# Patient Record
Sex: Female | Born: 1990 | Race: Black or African American | Hispanic: No | Marital: Married | Smoking: Never smoker
Health system: Southern US, Community
[De-identification: ages and names within clinical notes are randomized; demographics above are authoritative.]

## PROBLEM LIST (undated history)

## (undated) ENCOUNTER — Inpatient Hospital Stay (HOSPITAL_COMMUNITY): Payer: Self-pay

## (undated) DIAGNOSIS — Z789 Other specified health status: Secondary | ICD-10-CM

## (undated) HISTORY — PX: WISDOM TOOTH EXTRACTION: SHX21

---
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
## 2014-02-22 LAB — OB RESULTS CONSOLE HEPATITIS B SURFACE ANTIGEN: HEP B S AG: NEGATIVE

## 2014-02-22 LAB — OB RESULTS CONSOLE RUBELLA ANTIBODY, IGM: RUBELLA: IMMUNE

## 2014-02-22 LAB — OB RESULTS CONSOLE HIV ANTIBODY (ROUTINE TESTING): HIV: NONREACTIVE

## 2014-02-22 LAB — OB RESULTS CONSOLE RPR: RPR: NONREACTIVE

## 2014-09-19 ENCOUNTER — Inpatient Hospital Stay (HOSPITAL_COMMUNITY): Payer: 59 | Admitting: Anesthesiology

## 2014-09-19 ENCOUNTER — Encounter (HOSPITAL_COMMUNITY): Payer: Self-pay | Admitting: *Deleted

## 2014-09-19 ENCOUNTER — Inpatient Hospital Stay (HOSPITAL_COMMUNITY)
Admission: AD | Admit: 2014-09-19 | Discharge: 2014-09-19 | Disposition: A | Discharge status: Home / Self Care | Payer: 59 | Source: Ambulatory Visit | Attending: Obstetrics and Gynecology | Admitting: Obstetrics and Gynecology

## 2014-09-19 ENCOUNTER — Inpatient Hospital Stay (HOSPITAL_COMMUNITY)
Admission: AD | Admit: 2014-09-19 | Discharge: 2014-09-21 | DRG: 775 | Disposition: A | Discharge status: Home / Self Care | Payer: 59 | Source: Ambulatory Visit | Attending: Obstetrics and Gynecology | Admitting: Obstetrics and Gynecology

## 2014-09-19 DIAGNOSIS — O99824 Streptococcus B carrier state complicating childbirth: Principal | ICD-10-CM | POA: Diagnosis present

## 2014-09-19 DIAGNOSIS — Z3A38 38 weeks gestation of pregnancy: Secondary | ICD-10-CM | POA: Diagnosis present

## 2014-09-19 DIAGNOSIS — Z3490 Encounter for supervision of normal pregnancy, unspecified, unspecified trimester: Secondary | ICD-10-CM

## 2014-09-19 DIAGNOSIS — Z3483 Encounter for supervision of other normal pregnancy, third trimester: Secondary | ICD-10-CM | POA: Diagnosis present

## 2014-09-19 HISTORY — DX: Other specified health status: Z78.9

## 2014-09-19 LAB — RPR: RPR: NONREACTIVE

## 2014-09-19 LAB — CBC
HCT: 37.1 % (ref 36.0–46.0)
Hemoglobin: 12.2 g/dL (ref 12.0–15.0)
MCH: 23.6 pg — AB (ref 26.0–34.0)
MCHC: 32.9 g/dL (ref 30.0–36.0)
MCV: 71.8 fL — ABNORMAL LOW (ref 78.0–100.0)
Platelets: 244 10*3/uL (ref 150–400)
RBC: 5.17 MIL/uL — ABNORMAL HIGH (ref 3.87–5.11)
RDW: 15.4 % (ref 11.5–15.5)
WBC: 8.9 10*3/uL (ref 4.0–10.5)

## 2014-09-19 LAB — CBC WITH DIFFERENTIAL/PLATELET
Basophils Absolute: 0 10*3/uL (ref 0.0–0.1)
Basophils Relative: 0 % (ref 0–1)
Eosinophils Absolute: 0.1 10*3/uL (ref 0.0–0.7)
Eosinophils Relative: 1 % (ref 0–5)
HCT: 36.2 % (ref 36.0–46.0)
HEMOGLOBIN: 11.7 g/dL — AB (ref 12.0–15.0)
LYMPHS ABS: 1.7 10*3/uL (ref 0.7–4.0)
LYMPHS PCT: 22 % (ref 12–46)
MCH: 23.2 pg — AB (ref 26.0–34.0)
MCHC: 32.3 g/dL (ref 30.0–36.0)
MCV: 71.8 fL — ABNORMAL LOW (ref 78.0–100.0)
MONO ABS: 0.8 10*3/uL (ref 0.1–1.0)
MONOS PCT: 10 % (ref 3–12)
NEUTROS PCT: 67 % (ref 43–77)
Neutro Abs: 5.3 10*3/uL (ref 1.7–7.7)
Platelets: 215 10*3/uL (ref 150–400)
RBC: 5.04 MIL/uL (ref 3.87–5.11)
RDW: 15.6 % — ABNORMAL HIGH (ref 11.5–15.5)
WBC: 7.8 10*3/uL (ref 4.0–10.5)

## 2014-09-19 LAB — PROTEIN / CREATININE RATIO, URINE: CREATININE, URINE: 35 mg/dL

## 2014-09-19 LAB — COMPREHENSIVE METABOLIC PANEL
ALBUMIN: 3 g/dL — AB (ref 3.5–5.0)
ALK PHOS: 216 U/L — AB (ref 38–126)
ALT: 35 U/L (ref 14–54)
ANION GAP: 7 (ref 5–15)
AST: 35 U/L (ref 15–41)
BILIRUBIN TOTAL: 0.3 mg/dL (ref 0.3–1.2)
BUN: 10 mg/dL (ref 6–20)
CHLORIDE: 109 mmol/L (ref 101–111)
CO2: 21 mmol/L — ABNORMAL LOW (ref 22–32)
Calcium: 9.2 mg/dL (ref 8.9–10.3)
Creatinine, Ser: 0.63 mg/dL (ref 0.44–1.00)
GFR calc non Af Amer: >60 mL/min (ref >60)
GLUCOSE: 89 mg/dL (ref 70–99)
POTASSIUM: 3.6 mmol/L (ref 3.5–5.1)
Sodium: 137 mmol/L (ref 135–145)
Total Protein: 6.2 g/dL — ABNORMAL LOW (ref 6.5–8.1)

## 2014-09-19 LAB — URIC ACID: Uric Acid, Serum: 5.5 mg/dL (ref 2.3–6.6)

## 2014-09-19 LAB — URINALYSIS, ROUTINE W REFLEX MICROSCOPIC
BILIRUBIN URINE: NEGATIVE
GLUCOSE, UA: NEGATIVE mg/dL
HGB URINE DIPSTICK: NEGATIVE
KETONES UR: NEGATIVE mg/dL
Leukocytes, UA: NEGATIVE
Nitrite: NEGATIVE
PROTEIN: NEGATIVE mg/dL
Specific Gravity, Urine: 1.01 (ref 1.005–1.030)
Urobilinogen, UA: 0.2 mg/dL (ref 0.0–1.0)
pH: 6.5 (ref 5.0–8.0)

## 2014-09-19 LAB — ABO/RH: ABO/RH(D): AB POS

## 2014-09-19 LAB — LACTATE DEHYDROGENASE: LDH: 139 U/L (ref 98–192)

## 2014-09-19 LAB — TYPE AND SCREEN
ABO/RH(D): AB POS
Antibody Screen: NEGATIVE

## 2014-09-19 LAB — POCT FERN TEST: POCT FERN TEST: NEGATIVE

## 2014-09-19 LAB — OB RESULTS CONSOLE GBS: STREP GROUP B AG: POSITIVE

## 2014-09-19 MED ORDER — OXYCODONE-ACETAMINOPHEN 5-325 MG PO TABS: 2.0000 | ORAL_TABLET | ORAL | Status: DC | PRN

## 2014-09-19 MED ORDER — DIPHENHYDRAMINE HCL 50 MG/ML IJ SOLN: 12.5000 mg | INTRAMUSCULAR | Status: DC | PRN

## 2014-09-19 MED ORDER — FENTANYL 2.5 MCG/ML BUPIVACAINE 1/10 % EPIDURAL INFUSION (WH - ANES): 14.0000 mL/h | INTRAMUSCULAR | Status: DC | PRN

## 2014-09-19 MED ORDER — PENICILLIN G POTASSIUM 5000000 UNITS IJ SOLR
2.5000 10*6.[IU] | INTRAVENOUS | Status: DC
  Administered 2014-09-19 (×2): 2.5 10*6.[IU] via INTRAVENOUS
  Filled 2014-09-19 (×5): qty 2.5

## 2014-09-19 MED ORDER — PRENATAL MULTIVITAMIN CH
1.0000 | ORAL_TABLET | Freq: Every day | ORAL | Status: DC
  Administered 2014-09-20: 1 via ORAL
  Filled 2014-09-19: qty 1

## 2014-09-19 MED ORDER — ONDANSETRON HCL 4 MG PO TABS: 4.0000 mg | ORAL_TABLET | ORAL | Status: DC | PRN

## 2014-09-19 MED ORDER — CITRIC ACID-SODIUM CITRATE 334-500 MG/5ML PO SOLN: 30.0000 mL | ORAL | Status: DC | PRN

## 2014-09-19 MED ORDER — OXYCODONE-ACETAMINOPHEN 5-325 MG PO TABS: 1.0000 | ORAL_TABLET | ORAL | Status: DC | PRN

## 2014-09-19 MED ORDER — PHENYLEPHRINE 40 MCG/ML (10ML) SYRINGE FOR IV PUSH (FOR BLOOD PRESSURE SUPPORT)
80.0000 ug | PREFILLED_SYRINGE | INTRAVENOUS | Status: DC | PRN
  Filled 2014-09-19: qty 20

## 2014-09-19 MED ORDER — TETANUS-DIPHTH-ACELL PERTUSSIS 5-2.5-18.5 LF-MCG/0.5 IM SUSP
0.5000 mL | Freq: Once | INTRAMUSCULAR | Status: DC
  Filled 2014-09-19: qty 0.5

## 2014-09-19 MED ORDER — IBUPROFEN 600 MG PO TABS
600.0000 mg | ORAL_TABLET | Freq: Four times a day (QID) | ORAL | Status: DC
  Administered 2014-09-19 – 2014-09-21 (×6): 600 mg via ORAL
  Filled 2014-09-19 (×6): qty 1

## 2014-09-19 MED ORDER — ACETAMINOPHEN 325 MG PO TABS: 650.0000 mg | ORAL_TABLET | ORAL | Status: DC | PRN

## 2014-09-19 MED ORDER — OXYTOCIN BOLUS FROM INFUSION: 500.0000 mL | INTRAVENOUS | Status: DC

## 2014-09-19 MED ORDER — ACETAMINOPHEN 325 MG PO TABS
650.0000 mg | ORAL_TABLET | ORAL | Status: DC | PRN
  Administered 2014-09-19: 650 mg via ORAL
  Filled 2014-09-19: qty 2

## 2014-09-19 MED ORDER — OXYTOCIN 40 UNITS IN LACTATED RINGERS INFUSION - SIMPLE MED
62.5000 mL/h | INTRAVENOUS | Status: DC
  Filled 2014-09-19: qty 1000

## 2014-09-19 MED ORDER — MEDROXYPROGESTERONE ACETATE 150 MG/ML IM SUSP: 150.0000 mg | INTRAMUSCULAR | Status: DC | PRN

## 2014-09-19 MED ORDER — EPHEDRINE 5 MG/ML INJ: 10.0000 mg | INTRAVENOUS | Status: DC | PRN

## 2014-09-19 MED ORDER — BENZOCAINE-MENTHOL 20-0.5 % EX AERO
1.0000 "application " | INHALATION_SPRAY | CUTANEOUS | Status: DC | PRN
  Filled 2014-09-19: qty 56

## 2014-09-19 MED ORDER — DIBUCAINE 1 % RE OINT
1.0000 "application " | TOPICAL_OINTMENT | RECTAL | Status: DC | PRN
  Filled 2014-09-19: qty 28

## 2014-09-19 MED ORDER — PENICILLIN G POTASSIUM 5000000 UNITS IJ SOLR
5.0000 10*6.[IU] | Freq: Once | INTRAVENOUS | Status: AC
  Administered 2014-09-19: 5 10*6.[IU] via INTRAVENOUS
  Filled 2014-09-19: qty 5

## 2014-09-19 MED ORDER — FENTANYL 2.5 MCG/ML BUPIVACAINE 1/10 % EPIDURAL INFUSION (WH - ANES)
14.0000 mL/h | INTRAMUSCULAR | Status: DC | PRN
  Administered 2014-09-19 (×2): 14 mL/h via EPIDURAL
  Filled 2014-09-19 (×2): qty 125

## 2014-09-19 MED ORDER — LIDOCAINE HCL (PF) 1 % IJ SOLN
INTRAMUSCULAR | Status: DC | PRN
  Administered 2014-09-19: 10 mL

## 2014-09-19 MED ORDER — ONDANSETRON HCL 4 MG/2ML IJ SOLN: 4.0000 mg | Freq: Four times a day (QID) | INTRAMUSCULAR | Status: DC | PRN

## 2014-09-19 MED ORDER — SIMETHICONE 80 MG PO CHEW: 80.0000 mg | CHEWABLE_TABLET | ORAL | Status: DC | PRN

## 2014-09-19 MED ORDER — ZOLPIDEM TARTRATE 5 MG PO TABS: 5.0000 mg | ORAL_TABLET | Freq: Every evening | ORAL | Status: DC | PRN

## 2014-09-19 MED ORDER — LACTATED RINGERS IV SOLN
500.0000 mL | INTRAVENOUS | Status: DC | PRN
Start: 2014-09-19 — End: 2014-09-19
  Administered 2014-09-19: 500 mL via INTRAVENOUS

## 2014-09-19 MED ORDER — LANOLIN HYDROUS EX OINT: TOPICAL_OINTMENT | CUTANEOUS | Status: DC | PRN

## 2014-09-19 MED ORDER — SENNOSIDES-DOCUSATE SODIUM 8.6-50 MG PO TABS
2.0000 | ORAL_TABLET | ORAL | Status: DC
  Administered 2014-09-19 – 2014-09-20 (×2): 2 via ORAL
  Filled 2014-09-19 (×2): qty 2

## 2014-09-19 MED ORDER — MEASLES, MUMPS & RUBELLA VAC ~~LOC~~ INJ
0.5000 mL | INJECTION | Freq: Once | SUBCUTANEOUS | Status: DC
Start: 2014-09-20 — End: 2014-09-21

## 2014-09-19 MED ORDER — DIPHENHYDRAMINE HCL 25 MG PO CAPS
25.0000 mg | ORAL_CAPSULE | Freq: Four times a day (QID) | ORAL | Status: DC | PRN
Start: 2014-09-19 — End: 2014-09-21

## 2014-09-19 MED ORDER — LACTATED RINGERS IV SOLN
INTRAVENOUS | Status: DC
  Administered 2014-09-19 (×2): via INTRAVENOUS

## 2014-09-19 MED ORDER — WITCH HAZEL-GLYCERIN EX PADS: 1.0000 "application " | MEDICATED_PAD | CUTANEOUS | Status: DC | PRN

## 2014-09-19 MED ORDER — ONDANSETRON HCL 4 MG/2ML IJ SOLN: 4.0000 mg | INTRAMUSCULAR | Status: DC | PRN

## 2014-09-19 MED ORDER — LIDOCAINE HCL (PF) 1 % IJ SOLN
30.0000 mL | INTRAMUSCULAR | Status: DC | PRN
  Filled 2014-09-19: qty 30

## 2014-09-19 NOTE — Progress Notes (Signed)
Pt comfortable  FHT reassuring toco Q2-3 Cvx c/c/+1  A/P:  Will start pushing

## 2014-09-19 NOTE — MAU Note (Signed)
Pt states that she started ctx 3am yesterday and they are now 5-10 minutes apart. Pain7/10. +FM, possible LOF, denies VB.

## 2014-09-19 NOTE — MAU Note (Signed)
Pt reports her water broke about 1 hour ago. Still having ctx.

## 2014-09-19 NOTE — MAU Note (Signed)
Contractions. ? ROM

## 2014-09-19 NOTE — Anesthesia Procedure Notes (Signed)
Epidural Patient location during procedure: OB Start time: 09/19/2014 10:15 AM End time: 09/19/2014 11:30 AM  Staffing Anesthesiologist: Sebastian AcheMANNY, [place] Parco Performed by: anesthesiologist   Preanesthetic Checklist Completed: patient identified, site marked, surgical consent, pre-op evaluation, timeout performed, IV checked, risks and benefits discussed and monitors and equipment checked  Epidural Patient position: sitting Prep: site prepped and draped and DuraPrep Patient monitoring: heart rate, continuous pulse ox and blood pressure Approach: midline Location: L2-L3 Injection technique: LOR air  Needle:  Needle type: Tuohy  Needle gauge: 17 G Needle length: 9 cm and 9 Needle insertion depth: 5 cm Catheter type: closed end flexible Catheter size: 19 Gauge Catheter at skin depth: 14 cm Test dose: negative  Assessment Events: blood not aspirated, injection not painful, no injection resistance, negative IV test and no paresthesia  Additional Notes   Patient tolerated the insertion well without complications.Reason for block:procedure for pain

## 2014-09-19 NOTE — H&P (Signed)
Ellen Reese is a 24 y.o. female presenting for SROM/labor.  Pregnancy uncomplicated. History OB History    Gravida Para Term Preterm AB TAB SAB Ectopic Multiple Living   3    2          Past Medical History  Diagnosis Date  . Medical history non-contributory    Past Surgical History  Procedure Laterality Date  . Wisdom tooth extraction     Family History: family history is not on file. Social History:  reports that she has never smoked. She does not have any smokeless tobacco history on file. She reports that she does not drink alcohol or use illicit drugs.   Prenatal Transfer Tool  Maternal Diabetes: No Genetic Screening:  declined Maternal Ultrasounds/Referrals: Normal Fetal Ultrasounds or other Referrals:  None Maternal Substance Abuse:  No Significant Maternal Medications:  None Significant Maternal Lab Results:  None Other Comments:  None  ROS  Dilation: 2 Effacement (%): 100 Station: Ballotable Exam by:: K.Wilson,RN Blood pressure 136/91, pulse 122, temperature 98.4 F (36.9 C), temperature source Oral, resp. rate 18, height 5\' 2"  (1.575 m), weight 176 lb (79.833 kg). Exam Physical Exam  Prenatal labs: ABO, Rh: --/--/AB POS (05/05 16100826) Antibody: NEG (05/05 0826) Rubella:   RPR:    HBsAg:    HIV:    GBS:   positive  Assessment/Plan: Admit Epidural prn PCN for GBS prophylaxis   Rozanna Cormany 09/19/2014, 9:51 AM

## 2014-09-19 NOTE — Progress Notes (Addendum)
SVD of vigorous female infant w/ apgars of 9,9.  Mild (20sec) shoulder dystocia - McRoberts and Suprapubic performed but resolved with delivery of posterior (L) shoulder. Placenta delivered spontaneous w/ 3VC.   2nd degree lac repaired w/ 3-0 vicryl.  Fundus firm.  EBL 100cc .

## 2014-09-19 NOTE — Anesthesia Preprocedure Evaluation (Signed)
Anesthesia Evaluation  Patient identified by MRN, date of birth, ID band Patient awake and Patient confused    Reviewed: Allergy & Precautions, H&P , NPO status , Patient's Chart, lab work & pertinent test results  Airway Mallampati: II       Dental   Pulmonary  breath sounds clear to auscultation  Pulmonary exam normal       Cardiovascular Exercise Tolerance: Good Normal cardiovascular examRhythm:regular Rate:Normal     Neuro/Psych    GI/Hepatic   Endo/Other    Renal/GU      Musculoskeletal   Abdominal   Peds  Hematology   Anesthesia Other Findings   Reproductive/Obstetrics (+) Pregnancy                             Anesthesia Physical Anesthesia Plan  ASA: II  Anesthesia Plan: Epidural   Post-op Pain Management:    Induction:   Airway Management Planned:   Additional Equipment:   Intra-op Plan:   Post-operative Plan:   Informed Consent: I have reviewed the patients History and Physical, chart, labs and discussed the procedure including the risks, benefits and alternatives for the proposed anesthesia with the patient or authorized representative who has indicated his/her understanding and acceptance.     Plan Discussed with:   Anesthesia Plan Comments:         Anesthesia Quick Evaluation  

## 2014-09-20 LAB — CBC
HCT: 35 % — ABNORMAL LOW (ref 36.0–46.0)
HEMOGLOBIN: 11.2 g/dL — AB (ref 12.0–15.0)
MCH: 23 pg — AB (ref 26.0–34.0)
MCHC: 32 g/dL (ref 30.0–36.0)
MCV: 71.7 fL — AB (ref 78.0–100.0)
Platelets: 205 10*3/uL (ref 150–400)
RBC: 4.88 MIL/uL (ref 3.87–5.11)
RDW: 15.5 % (ref 11.5–15.5)
WBC: 15.8 10*3/uL — ABNORMAL HIGH (ref 4.0–10.5)

## 2014-09-20 NOTE — Progress Notes (Signed)
Post Partum Day 1 Subjective: no complaints, up ad lib, voiding, tolerating PO and + flatus  Objective: Blood pressure 114/58, pulse 83, temperature 98.2 F (36.8 C), temperature source Oral, resp. rate 20, height 5\' 2"  (1.575 m), weight 176 lb (79.833 kg), SpO2 100 %, unknown if currently breastfeeding.  Physical Exam:  General: alert and cooperative Lochia: appropriate Uterine Fundus: firm Incision: healing well DVT Evaluation: No evidence of DVT seen on physical exam. Negative Homan's sign. No cords or calf tenderness. No significant calf/ankle edema.   Recent Labs  09/19/14 0826 09/20/14 0555  HGB 12.2 11.2*  HCT 37.1 35.0*    Assessment/Plan: Plan for discharge tomorrow and Circumcision prior to discharge   LOS: 1 day   Jesscia Imm G 09/20/2014, 8:03 AM

## 2014-09-20 NOTE — Anesthesia Postprocedure Evaluation (Signed)
Anesthesia Post Note  Patient: Ellen Reese  Procedure(s) Performed: * No procedures listed *  Anesthesia type: Epidural  Patient location: Mother/Baby  Post pain: Pain level controlled  Post assessment: Post-op Vital signs reviewed  Last Vitals:  Filed Vitals:   09/20/14 0215  BP: 114/58  Pulse: 83  Temp: 36.8 C  Resp: 20    Post vital signs: Reviewed  Level of consciousness:alert  Complications: No apparent anesthesia complications

## 2014-09-20 NOTE — Lactation Note (Signed)
This note was copied from the chart of Ellen Reese. Lactation Consultation Note New mom states baby is latching well, denies painful latches. Baby had a pacifier in mouth. Pacifier use not recommended at this time.  Mom encouraged to feed baby 8-12 times/24 hours and with feeding cues. Mom encouraged to waken baby for feeds.  Educated about newborn behavior. Referred to Baby and Me Book in Breastfeeding section Pg. 22-23 for position options and Proper latch demonstration.[facility] brochure given w/resources, support groups and LC services.Encouraged to call for assistance if needed and to verify proper latch.Mom encouraged to do skin-to-skin. Patient Name: Ellen Reese ZOXWR'UToday's Date: 09/20/2014 Reason for consult: Initial assessment   Maternal Data Has patient been taught Hand Expression?: Yes Does the patient have breastfeeding experience prior to this delivery?: No  Feeding Feeding Type: Breast Fed Length of feed: 15 min  LATCH Score/Interventions       Type of Nipple: Everted at rest and after stimulation  Comfort (Breast/Nipple): Soft / non-tender     Intervention(s): Breastfeeding basics reviewed;Support Pillows;Position options;Skin to skin     Lactation Tools Discussed/Used     Consult Status Consult Status: Follow-up Date: 09/21/14 Follow-up type: In-patient    Shamya Macfadden, Diamond NickelLAURA G 09/20/2014, 3:58 AM

## 2014-09-21 NOTE — Discharge Summary (Signed)
Obstetric Discharge Summary Reason for Admission: onset of labor and rupture of membranes Prenatal Procedures: none Intrapartum Procedures: spontaneous vaginal delivery Postpartum Procedures: none Complications-Operative and Postpartum: 2nd degree perineal laceration HEMOGLOBIN  Date Value Ref Range Status  09/20/2014 11.2* 12.0 - 15.0 g/dL Final   HCT  Date Value Ref Range Status  09/20/2014 35.0* 36.0 - 46.0 % Final    Physical Exam:  General: alert Lochia: appropriate Uterine Fundus: firm Incision: healing well DVT Evaluation: No evidence of DVT seen on physical exam.  Discharge Diagnoses: Term Pregnancy-delivered  Discharge Information: Date: 09/21/2014 Activity: pelvic rest Diet: routine Medications: None Condition: stable Instructions: refer to practice specific booklet Discharge to: home   Newborn Data: Live born female  Birth Weight: 6 lb 15.3 oz (3155 g) APGAR: 9, 9  Home with mother.  Ellen Reese L 09/21/2014, 7:53 AM

## 2016-10-09 ENCOUNTER — Encounter (HOSPITAL_COMMUNITY): Payer: Self-pay

## 2016-10-09 ENCOUNTER — Inpatient Hospital Stay (HOSPITAL_COMMUNITY)
Admission: AD | Admit: 2016-10-09 | Discharge: 2016-10-09 | Disposition: A | Discharge status: Home / Self Care | Payer: 59 | Source: Ambulatory Visit | Attending: Obstetrics and Gynecology | Admitting: Obstetrics and Gynecology

## 2016-10-09 DIAGNOSIS — O26891 Other specified pregnancy related conditions, first trimester: Secondary | ICD-10-CM | POA: Diagnosis not present

## 2016-10-09 DIAGNOSIS — Z3A01 Less than 8 weeks gestation of pregnancy: Secondary | ICD-10-CM | POA: Insufficient documentation

## 2016-10-09 DIAGNOSIS — Y92410 Unspecified street and highway as the place of occurrence of the external cause: Secondary | ICD-10-CM | POA: Insufficient documentation

## 2016-10-09 DIAGNOSIS — O9989 Other specified diseases and conditions complicating pregnancy, childbirth and the puerperium: Secondary | ICD-10-CM | POA: Diagnosis not present

## 2016-10-09 LAB — HCG, QUANTITATIVE, PREGNANCY: hCG, Beta Chain, Quant, S: 5301 m[IU]/mL — ABNORMAL HIGH (ref <5)

## 2016-10-09 LAB — ABO/RH: ABO/RH(D): AB POS

## 2016-10-09 LAB — URINALYSIS, ROUTINE W REFLEX MICROSCOPIC
BILIRUBIN URINE: NEGATIVE
Glucose, UA: NEGATIVE mg/dL
HGB URINE DIPSTICK: NEGATIVE
Ketones, ur: NEGATIVE mg/dL
Leukocytes, UA: NEGATIVE
NITRITE: NEGATIVE
PH: 7 (ref 5.0–8.0)
Protein, ur: NEGATIVE mg/dL
SPECIFIC GRAVITY, URINE: 1.015 (ref 1.005–1.030)

## 2016-10-09 LAB — POCT PREGNANCY, URINE: PREG TEST UR: POSITIVE — AB
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
## 2016-11-02 LAB — OB RESULTS CONSOLE RPR: RPR: NONREACTIVE

## 2016-11-02 LAB — OB RESULTS CONSOLE ABO/RH: RH TYPE: POSITIVE

## 2016-11-02 LAB — OB RESULTS CONSOLE RUBELLA ANTIBODY, IGM: Rubella: IMMUNE

## 2016-11-02 LAB — OB RESULTS CONSOLE GC/CHLAMYDIA
CHLAMYDIA, DNA PROBE: NEGATIVE
Gonorrhea: NEGATIVE

## 2016-11-02 LAB — OB RESULTS CONSOLE HEPATITIS B SURFACE ANTIGEN: HEP B S AG: NEGATIVE

## 2016-11-02 LAB — OB RESULTS CONSOLE HIV ANTIBODY (ROUTINE TESTING): HIV: NONREACTIVE

## 2017-05-18 ENCOUNTER — Encounter (HOSPITAL_COMMUNITY): Payer: Self-pay | Admitting: *Deleted

## 2017-05-18 ENCOUNTER — Telehealth (HOSPITAL_COMMUNITY): Payer: Self-pay | Admitting: *Deleted

## 2017-05-18 LAB — OB RESULTS CONSOLE GBS: GBS: NEGATIVE

## 2017-05-18 NOTE — Telephone Encounter (Signed)
Preadmission screen  

## 2017-05-19 ENCOUNTER — Encounter (HOSPITAL_COMMUNITY): Payer: Self-pay | Admitting: *Deleted

## 2017-05-20 ENCOUNTER — Inpatient Hospital Stay (HOSPITAL_COMMUNITY)
Admission: AD | Admit: 2017-05-20 | Discharge: 2017-05-21 | Disposition: A | Discharge status: Home / Self Care | Payer: 59 | Source: Ambulatory Visit | Attending: Obstetrics and Gynecology | Admitting: Obstetrics and Gynecology

## 2017-05-20 DIAGNOSIS — O479 False labor, unspecified: Secondary | ICD-10-CM

## 2017-05-21 ENCOUNTER — Encounter (HOSPITAL_COMMUNITY): Payer: Self-pay | Admitting: *Deleted

## 2017-05-21 ENCOUNTER — Encounter (HOSPITAL_COMMUNITY): Payer: Self-pay

## 2017-05-21 ENCOUNTER — Other Ambulatory Visit: Payer: Self-pay

## 2017-05-21 ENCOUNTER — Inpatient Hospital Stay (HOSPITAL_COMMUNITY)
Admission: AD | Admit: 2017-05-21 | Discharge: 2017-05-23 | DRG: 807 | Disposition: A | Discharge status: Home / Self Care | Payer: 59 | Source: Ambulatory Visit | Attending: Obstetrics and Gynecology | Admitting: Obstetrics and Gynecology

## 2017-05-21 ENCOUNTER — Inpatient Hospital Stay (HOSPITAL_COMMUNITY): Payer: 59

## 2017-05-21 DIAGNOSIS — O4693 Antepartum hemorrhage, unspecified, third trimester: Secondary | ICD-10-CM

## 2017-05-21 DIAGNOSIS — Z3A37 37 weeks gestation of pregnancy: Secondary | ICD-10-CM | POA: Diagnosis not present

## 2017-05-21 LAB — URINALYSIS, ROUTINE W REFLEX MICROSCOPIC
Bilirubin Urine: NEGATIVE
GLUCOSE, UA: NEGATIVE mg/dL
Hgb urine dipstick: NEGATIVE
Ketones, ur: NEGATIVE mg/dL
LEUKOCYTES UA: NEGATIVE
Nitrite: NEGATIVE
Protein, ur: NEGATIVE mg/dL
Specific Gravity, Urine: 1.008 (ref 1.005–1.030)
pH: 6 (ref 5.0–8.0)

## 2017-05-21 MED ORDER — ZOLPIDEM TARTRATE 5 MG PO TABS
5.0000 mg | ORAL_TABLET | Freq: Once | ORAL | Status: AC
  Administered 2017-05-21: 5 mg via ORAL
  Filled 2017-05-21: qty 1

## 2017-05-21 NOTE — MAU Provider Note (Signed)
History     CSN: [id number]  Arrival date and time: 05/21/17 2257   First Provider Initiated Contact with Patient 05/21/17 2318     Chief Complaint  Patient presents with  . Rupture of Membranes  . Contractions   HPI Ellen Reese is a 27 y.o. [phone] at [redacted]w[redacted]d who presents with possible rupture of membranes. She reports a gush of clear fluid at 11pm and contractions since 9pm. She states the contractions are every 2-3 minutes and she rates the pain a 6/10. She reports good fetal movement. Upon arrival in MAU, patient called out reporting vaginal bleeding with clots.   OB History    Gravida Para Term Preterm AB Living   4 1 1   2 1    SAB TAB Ectopic Multiple Live Births     2   0 1      Past Medical History:  Diagnosis Date  . Medical history non-contributory     Past Surgical History:  Procedure Laterality Date  . WISDOM TOOTH EXTRACTION      Family History  Problem Relation Age of Onset  . Prostate cancer Maternal Grandfather   . Hypertension Maternal Grandfather   . Breast cancer Paternal Grandmother     Social History   Tobacco Use  . Smoking status: Never Smoker  . Smokeless tobacco: Never Used  Substance Use Topics  . Alcohol use: No  . Drug use: No    Allergies: No Known Allergies  Medications Prior to Admission  Medication Sig Dispense Refill Last Dose  . Prenatal Vit-Fe Fumarate-FA (MULTIVITAMIN-PRENATAL) 27-0.8 MG TABS tablet Take 1 tablet by mouth daily at 12 noon.   05/20/2017 at Unknown time  . zolpidem (AMBIEN) 5 MG tablet Take 5 mg by mouth at bedtime as needed for sleep.   05/20/2017 at Unknown time    Review of Systems  Constitutional: Negative.  Negative for fatigue and fever.  HENT: Negative.   Respiratory: Negative.  Negative for shortness of breath.   Cardiovascular: Negative.  Negative for chest pain.  Gastrointestinal: Positive for abdominal pain. Negative for constipation, diarrhea, nausea and vomiting.  Genitourinary: Positive  for vaginal bleeding and vaginal discharge. Negative for dysuria.  Neurological: Negative.  Negative for dizziness and headaches.   Physical Exam   Blood pressure 135/85, pulse (!) 109, temperature 99 F (37.2 C), temperature source Oral, resp. rate 19, weight 189 lb 1.9 oz (85.8 kg), last menstrual period 09/01/2016, unknown if currently breastfeeding.  Physical Exam  Nursing note and vitals reviewed. Constitutional: She is oriented to person, place, and time. She appears well-developed and well-nourished. No distress.  HENT:  Head: Normocephalic.  Eyes: Pupils are equal, round, and reactive to light.  Cardiovascular: Normal rate, regular rhythm and normal heart sounds.  Respiratory: Effort normal and breath sounds normal. No respiratory distress.  GI: Soft. Bowel sounds are normal. She exhibits no distension. There is no tenderness.  Genitourinary: There is bleeding in the vagina.  Genitourinary Comments: Moderate amount of bright red vaginal bleeding in vault, trailing membranes noted  Neurological: She is alert and oriented to person, place, and time.  Skin: Skin is warm and dry.  Psychiatric: She has a normal mood and affect. Her behavior is normal. Judgment and thought content normal.   Fetal Tracing:  Baseline: 135 Variability: moderate Accels: 10x10 Decels: none  Toco: 2-3  Dilation: 4 Effacement (%): 80 Station: -3 Presentation: Vertex Exam by:: Renell simpson rn   MAU Course  Procedures Results for orders  placed or performed during the hospital encounter of 05/20/17 (from the past 24 hour(s))  Urinalysis, Routine w reflex microscopic     Status: None   Collection Time: 05/21/17 12:15 AM  Result Value Ref Range   Color, Urine YELLOW YELLOW   APPearance CLEAR CLEAR   Specific Gravity, Urine 1.008 1.005 - 1.030   pH 6.0 5.0 - 8.0   Glucose, UA NEGATIVE NEGATIVE mg/dL   Hgb urine dipstick NEGATIVE NEGATIVE   Bilirubin Urine NEGATIVE NEGATIVE   Ketones, ur  NEGATIVE NEGATIVE mg/dL   Protein, ur NEGATIVE NEGATIVE mg/dL   Nitrite NEGATIVE NEGATIVE   Leukocytes, UA NEGATIVE NEGATIVE   MDM US MFM OB Limited- posterior placenta, no sign of abruption on visualized portions of placenta  Upon RN cervical recheck, patient 8cm dilated. Assessment and Plan  Normal Labor  -Admit to labor and delivery -Dr. Renaldo FiddlerAdkins notified and CNM at bedside for stand by until MD arrives   Rolm BookbinderCaroline M Isabell Bonafede CNM 05/21/2017, 11:26 PM

## 2017-05-21 NOTE — MAU Note (Signed)
Ctxs off and on all night. Awoke 0600 and had blood on tissue. Contractions all day and unable to sleep since 0300. May have lost mucous plug an hour ago. Have cont to see blood when I wipe all day. NO lOF

## 2017-05-21 NOTE — MAU Note (Signed)
Pt presents to MAU c/o ctxs every as well as SROM @ 2300. Pt reports some bloody show and Good FM

## 2017-05-21 NOTE — Discharge Instructions (Signed)
Braxton Hicks Contractions °Contractions of the uterus can occur throughout pregnancy, but they are not always a sign that you are in labor. You may have practice contractions called Braxton Hicks contractions. These false labor contractions are sometimes confused with true labor. °What are Braxton Hicks contractions? °Braxton Hicks contractions are tightening movements that occur in the muscles of the uterus before labor. Unlike true labor contractions, these contractions do not result in opening (dilation) and thinning of the cervix. Toward the end of pregnancy (32-34 weeks), Braxton Hicks contractions can happen more often and may become stronger. These contractions are sometimes difficult to tell apart from true labor because they can be very uncomfortable. You should not feel embarrassed if you go to the hospital with false labor. °Sometimes, the only way to tell if you are in true labor is for your health care provider to look for changes in the cervix. The health care provider will do a physical exam and may monitor your contractions. If you are not in true labor, the exam should show that your cervix is not dilating and your water has not broken. °If there are other health problems associated with your pregnancy, it is completely safe for you to be sent home with false labor. You may continue to have Braxton Hicks contractions until you go into true labor. °How to tell the difference between true labor and false labor °True labor °· Contractions last 30-70 seconds. °· Contractions become very regular. °· Discomfort is usually felt in the top of the uterus, and it spreads to the lower abdomen and low back. °· Contractions do not go away with walking. °· Contractions usually become more intense and increase in frequency. °· The cervix dilates and gets thinner. °False labor °· Contractions are usually shorter and not as strong as true labor contractions. °· Contractions are usually irregular. °· Contractions  are often felt in the front of the lower abdomen and in the groin. °· Contractions may go away when you walk around or change positions while lying down. °· Contractions get weaker and are shorter-lasting as time goes on. °· The cervix usually does not dilate or become thin. °Follow these instructions at home: °· Take over-the-counter and prescription medicines only as told by your health care provider. °· Keep up with your usual exercises and follow other instructions from your health care provider. °· Eat and drink lightly if you think you are going into labor. °· If Braxton Hicks contractions are making you uncomfortable: °? Change your position from lying down or resting to walking, or change from walking to resting. °? Sit and rest in a tub of warm water. °? Drink enough fluid to keep your urine pale yellow. Dehydration may cause these contractions. °? Do slow and deep breathing several times an hour. °· Keep all follow-up prenatal visits as told by your health care provider. This is important. °Contact a health care provider if: °· You have a fever. °· You have continuous pain in your abdomen. °Get help right away if: °· Your contractions become stronger, more regular, and closer together. °· You have fluid leaking or gushing from your vagina. °· You pass blood-tinged mucus (bloody show). °· You have bleeding from your vagina. °· You have low back pain that you never had before. °· You feel your baby’s head pushing down and causing pelvic pressure. °· Your baby is not moving inside you as much as it used to. °Summary °· Contractions that occur before labor are called Braxton   Hicks contractions, false labor, or practice contractions. °· Braxton Hicks contractions are usually shorter, weaker, farther apart, and less regular than true labor contractions. True labor contractions usually become progressively stronger and regular and they become more frequent. °· Manage discomfort from Braxton Hicks contractions by  changing position, resting in a warm bath, drinking plenty of water, or practicing deep breathing. °This information is not intended to replace advice given to you by your health care provider. Make sure you discuss any questions you have with your health care provider. °Document Released: 09/16/2016 Document Revised: 09/16/2016 Document Reviewed: 09/16/2016 °Elsevier Interactive Patient Education © 2018 [employer] ° °Third Trimester of Pregnancy °The third trimester is from week 29 through week 42, months 7 through 9. This trimester is when your unborn baby (fetus) is growing very fast. At the end of the ninth month, the unborn baby is about 20 inches in length. It weighs about 6-10 pounds. °Follow these instructions at home: °· Avoid all smoking, herbs, and alcohol. Avoid drugs not approved by your doctor. °· Do not use any tobacco products, including cigarettes, chewing tobacco, and electronic cigarettes. If you need help quitting, ask your doctor. You may get counseling or other support to help you quit. °· Only take medicine as told by your doctor. Some medicines are safe and some are not during pregnancy. °· Exercise only as told by your doctor. Stop exercising if you start having cramps. °· Eat regular, healthy meals. °· Wear a good support bra if your breasts are tender. °· Do not use hot tubs, steam rooms, or saunas. °· Wear your seat belt when driving. °· Avoid raw meat, uncooked cheese, and liter boxes and soil used by cats. °· Take your prenatal vitamins. °· Take 1500-2000 milligrams of calcium daily starting at the 20th week of pregnancy until you deliver your baby. °· Try taking medicine that helps you poop (stool softener) as needed, and if your doctor approves. Eat more fiber by eating fresh fruit, vegetables, and whole grains. Drink enough fluids to keep your pee (urine) clear or pale yellow. °· Take warm water baths (sitz baths) to soothe pain or discomfort caused by hemorrhoids. Use hemorrhoid  cream if your doctor approves. °· If you have puffy, bulging veins (varicose veins), wear support hose. Raise (elevate) your feet for 15 minutes, 3-4 times a day. Limit salt in your diet. °· Avoid heavy lifting, wear low heels, and sit up straight. °· Rest with your legs raised if you have leg cramps or low back pain. °· Visit your dentist if you have not gone during your pregnancy. Use a soft toothbrush to brush your teeth. Be gentle when you floss. °· You can have sex (intercourse) unless your doctor tells you not to. °· Do not travel far distances unless you must. Only do so with your doctor's approval. °· Take prenatal classes. °· Practice driving to the hospital. °· Pack your hospital bag. °· Prepare the baby's room. °· Go to your doctor visits. °Get help if: °· You are not sure if you are in labor or if your water has broken. °· You are dizzy. °· You have mild cramps or pressure in your lower belly (abdominal). °· You have a nagging pain in your belly area. °· You continue to feel sick to your stomach (nauseous), throw up (vomit), or have watery poop (diarrhea). °· You have bad smelling fluid coming from your vagina. °· You have pain with peeing (urination). °Get help right away if: °·   You have a fever. °· You are leaking fluid from your vagina. °· You are spotting or bleeding from your vagina. °· You have severe belly cramping or pain. °· You lose or gain weight rapidly. °· You have trouble catching your breath and have chest pain. °· You notice sudden or extreme puffiness (swelling) of your face, hands, ankles, feet, or legs. °· You have not felt the baby move in over an hour. °· You have severe headaches that do not go away with medicine. °· You have vision changes. °This information is not intended to replace advice given to you by your health care provider. Make sure you discuss any questions you have with your health care provider. °Document Released: 07/28/2009 Document Revised: 10/09/2015 Document  Reviewed: 07/04/2012 °Elsevier Interactive Patient Education © 2017 [employer] Inc. ° °

## 2017-05-21 NOTE — MAU Provider Note (Signed)
I have communicated with Dr Renaldo FiddlerAdkins and reviewed vital signs:  Vitals:   05/21/17 0325 05/21/17 0542  BP: 121/75 122/81  Pulse: (!) 101 (!) 102  Resp: 18 18  Temp:  98.4 F (36.9 C)    Vaginal exam:  Dilation: 4 Effacement (%): 70 Cervical Position: Middle Station: -3 Presentation: Vertex Exam by:: Amaia Lavallie Forensic scientiststanley RN,   Also reviewed contraction pattern and that non-stress test is reactive.  It has been documented that patient is contracting every 4-7 minutes with minimal cervical change over 3 hours not indicating active labor.  Patient denies any other complaints.  Based on this report provider has given order for discharge.  A discharge order and diagnosis entered by a provider.   Labor discharge instructions reviewed with patient.

## 2017-05-22 ENCOUNTER — Encounter (HOSPITAL_COMMUNITY): Payer: Self-pay

## 2017-05-22 DIAGNOSIS — Z3A37 37 weeks gestation of pregnancy: Secondary | ICD-10-CM | POA: Diagnosis not present

## 2017-05-22 DIAGNOSIS — Z3483 Encounter for supervision of other normal pregnancy, third trimester: Secondary | ICD-10-CM | POA: Diagnosis present

## 2017-05-22 LAB — CBC
HCT: 37.1 % (ref 36.0–46.0)
HCT: 40 % (ref 36.0–46.0)
HEMOGLOBIN: 12.9 g/dL (ref 12.0–15.0)
Hemoglobin: 11.8 g/dL — ABNORMAL LOW (ref 12.0–15.0)
MCH: 22.6 pg — AB (ref 26.0–34.0)
MCH: 22.6 pg — AB (ref 26.0–34.0)
MCHC: 31.8 g/dL (ref 30.0–36.0)
MCHC: 32.3 g/dL (ref 30.0–36.0)
MCV: 70.1 fL — ABNORMAL LOW (ref 78.0–100.0)
MCV: 71.1 fL — ABNORMAL LOW (ref 78.0–100.0)
PLATELETS: 243 10*3/uL (ref 150–400)
PLATELETS: 271 10*3/uL (ref 150–400)
RBC: 5.22 MIL/uL — ABNORMAL HIGH (ref 3.87–5.11)
RBC: 5.71 MIL/uL — AB (ref 3.87–5.11)
RDW: 15.6 % — AB (ref 11.5–15.5)
RDW: 16.1 % — ABNORMAL HIGH (ref 11.5–15.5)
WBC: 12.7 10*3/uL — ABNORMAL HIGH (ref 4.0–10.5)
WBC: 9 10*3/uL (ref 4.0–10.5)

## 2017-05-22 LAB — TYPE AND SCREEN
ABO/RH(D): AB POS
ANTIBODY SCREEN: NEGATIVE

## 2017-05-22 LAB — RPR: RPR: NONREACTIVE

## 2017-05-22 MED ORDER — OXYCODONE-ACETAMINOPHEN 5-325 MG PO TABS: 1.0000 | ORAL_TABLET | ORAL | Status: DC | PRN

## 2017-05-22 MED ORDER — SENNOSIDES-DOCUSATE SODIUM 8.6-50 MG PO TABS
2.0000 | ORAL_TABLET | ORAL | Status: DC
  Administered 2017-05-22: 2 via ORAL
  Filled 2017-05-22: qty 2

## 2017-05-22 MED ORDER — LIDOCAINE HCL (PF) 1 % IJ SOLN
INTRAMUSCULAR | Status: AC
  Filled 2017-05-22: qty 30

## 2017-05-22 MED ORDER — OXYCODONE-ACETAMINOPHEN 5-325 MG PO TABS: 2.0000 | ORAL_TABLET | ORAL | Status: DC | PRN

## 2017-05-22 MED ORDER — MISOPROSTOL 200 MCG PO TABS
ORAL_TABLET | ORAL | Status: AC
  Filled 2017-05-22: qty 5

## 2017-05-22 MED ORDER — ONDANSETRON HCL 4 MG PO TABS: 4.0000 mg | ORAL_TABLET | ORAL | Status: DC | PRN

## 2017-05-22 MED ORDER — MEASLES, MUMPS & RUBELLA VAC ~~LOC~~ INJ
0.5000 mL | INJECTION | Freq: Once | SUBCUTANEOUS | Status: DC
  Filled 2017-05-22: qty 0.5

## 2017-05-22 MED ORDER — LACTATED RINGERS IV SOLN: 500.0000 mL | INTRAVENOUS | Status: DC | PRN

## 2017-05-22 MED ORDER — SOD CITRATE-CITRIC ACID 500-334 MG/5ML PO SOLN: 30.0000 mL | ORAL | Status: DC | PRN

## 2017-05-22 MED ORDER — OXYTOCIN BOLUS FROM INFUSION
500.0000 mL | Freq: Once | INTRAVENOUS | Status: AC
  Administered 2017-05-22: 500 mL via INTRAVENOUS

## 2017-05-22 MED ORDER — ACETAMINOPHEN 325 MG PO TABS: 650.0000 mg | ORAL_TABLET | ORAL | Status: DC | PRN

## 2017-05-22 MED ORDER — COCONUT OIL OIL: 1.0000 "application " | TOPICAL_OIL | Status: DC | PRN

## 2017-05-22 MED ORDER — DIBUCAINE 1 % RE OINT: 1.0000 "application " | TOPICAL_OINTMENT | RECTAL | Status: DC | PRN

## 2017-05-22 MED ORDER — ONDANSETRON HCL 4 MG/2ML IJ SOLN: 4.0000 mg | INTRAMUSCULAR | Status: DC | PRN

## 2017-05-22 MED ORDER — OXYTOCIN 40 UNITS IN LACTATED RINGERS INFUSION - SIMPLE MED
INTRAVENOUS | Status: AC
  Filled 2017-05-22: qty 1000

## 2017-05-22 MED ORDER — LACTATED RINGERS IV SOLN
INTRAVENOUS | Status: DC
  Administered 2017-05-22: via INTRAVENOUS

## 2017-05-22 MED ORDER — LIDOCAINE HCL (PF) 1 % IJ SOLN
30.0000 mL | INTRAMUSCULAR | Status: DC | PRN
  Filled 2017-05-22: qty 30

## 2017-05-22 MED ORDER — DIPHENHYDRAMINE HCL 25 MG PO CAPS: 25.0000 mg | ORAL_CAPSULE | Freq: Four times a day (QID) | ORAL | Status: DC | PRN

## 2017-05-22 MED ORDER — BENZOCAINE-MENTHOL 20-0.5 % EX AERO: 1.0000 "application " | INHALATION_SPRAY | CUTANEOUS | Status: DC | PRN

## 2017-05-22 MED ORDER — ONDANSETRON HCL 4 MG/2ML IJ SOLN: 4.0000 mg | Freq: Four times a day (QID) | INTRAMUSCULAR | Status: DC | PRN

## 2017-05-22 MED ORDER — IBUPROFEN 600 MG PO TABS
600.0000 mg | ORAL_TABLET | Freq: Four times a day (QID) | ORAL | Status: DC
  Administered 2017-05-22 – 2017-05-23 (×6): 600 mg via ORAL
  Filled 2017-05-22 (×6): qty 1

## 2017-05-22 MED ORDER — SIMETHICONE 80 MG PO CHEW: 80.0000 mg | CHEWABLE_TABLET | ORAL | Status: DC | PRN

## 2017-05-22 MED ORDER — PRENATAL MULTIVITAMIN CH
1.0000 | ORAL_TABLET | Freq: Every day | ORAL | Status: DC
  Administered 2017-05-22: 1 via ORAL
  Filled 2017-05-22: qty 1

## 2017-05-22 MED ORDER — MEDROXYPROGESTERONE ACETATE 150 MG/ML IM SUSP: 150.0000 mg | INTRAMUSCULAR | Status: DC | PRN

## 2017-05-22 MED ORDER — TETANUS-DIPHTH-ACELL PERTUSSIS 5-2.5-18.5 LF-MCG/0.5 IM SUSP: 0.5000 mL | Freq: Once | INTRAMUSCULAR | Status: DC

## 2017-05-22 MED ORDER — WITCH HAZEL-GLYCERIN EX PADS: 1.0000 "application " | MEDICATED_PAD | CUTANEOUS | Status: DC | PRN

## 2017-05-22 MED ORDER — OXYTOCIN 40 UNITS IN LACTATED RINGERS INFUSION - SIMPLE MED
2.5000 [IU]/h | INTRAVENOUS | Status: DC
  Administered 2017-05-22: 2.5 [IU]/h via INTRAVENOUS

## 2017-05-22 NOTE — Progress Notes (Signed)
SVD of vigorous female infant w/ apgars of 7,7.  Bloody fluid noted after delivery of head and shoulders Placenta delivered spontaneous w/ 3VC.   Small clot noted on surface ? Abruption - will send to path Fundus firm.  EBL 200cc .

## 2017-05-22 NOTE — H&P (Signed)
Ellen RowanDanielle N Reese is a 27 y.o. female presenting for SOL.  Upon initial presentation to [facility]U, pt was c/o vaginal bleeding and increase of ctx.  Cvx was 4cm and she quickly progressed to 7.5cm.  + LOF 11pm   OB History    Gravida Para Term Preterm AB Living   4 1 1   2 1    SAB TAB Ectopic Multiple Live Births     2   0 1     Past Medical History:  Diagnosis Date  . Medical history non-contributory    Past Surgical History:  Procedure Laterality Date  . WISDOM TOOTH EXTRACTION     Family History: family history includes Breast cancer in her paternal grandmother; Hypertension in her maternal grandfather; Prostate cancer in her maternal grandfather. Social History:  reports that  has never smoked. she has never used smokeless tobacco. She reports that she does not drink alcohol or use drugs.     Maternal Diabetes: No Genetic Screening: Declined Maternal Ultrasounds/Referrals: Normal Fetal Ultrasounds or other Referrals:  None Maternal Substance Abuse:  No Significant Maternal Medications:  None Significant Maternal Lab Results:  None Other Comments:  None  ROS History Dilation: 10 Effacement (%): 90 Station: -2 Exam by:: Renaldo FiddlerAdkins, MD Blood pressure 135/85, pulse (!) 109, temperature 99 F (37.2 C), temperature source Oral, resp. rate 19, weight 189 lb 1.9 oz (85.8 kg), last menstrual period 09/01/2016, unknown if currently breastfeeding. Exam Physical Exam  Prenatal labs: ABO, Rh: AB/Positive/-- (06/19 0000) Antibody:   Rubella: Immune (06/19 0000) RPR: Nonreactive (06/19 0000)  HBsAg: Negative (06/19 0000)  HIV: Non-reactive (06/19 0000)  GBS:     Assessment/Plan: Admit Exp mngt   Ellen Reese 05/22/2017, 12:41 AM

## 2017-05-22 NOTE — Progress Notes (Signed)
Post Partum Day 0 Subjective: no complaints  Objective: Blood pressure 131/86, pulse 80, temperature 98.2 F (36.8 C), temperature source Oral, resp. rate 18, height 5\' 2"  (1.575 m), weight 189 lb (85.7 kg), last menstrual period 09/01/2016, unknown if currently breastfeeding.  Physical Exam:  General: alert and cooperative Lochia: appropriate Uterine Fundus: firm Incision: n/a DVT Evaluation: No evidence of DVT seen on physical exam.  Recent Labs    05/22/17 0041 05/22/17 0550  HGB 12.9 11.8*  HCT 40.0 37.1    Assessment/Plan: Plan for discharge tomorrow   LOS: 0 days   Ellen Reese 05/22/2017, 9:04 AM

## 2017-05-22 NOTE — Lactation Note (Signed)
This note was copied from a baby's chart. Lactation Consultation Note  Patient Name: Ellen Reese ZHYQM'VToday's Date: 05/22/2017 Reason for consult: Initial assessment   P2, Ex BF for 11 months.  Mother denies problems or questions but states baby has been sleepy. Reviewed waking techniques and encouraged spoon feeding if needed to interest baby. Mom encouraged to feed baby 8-12 times/24 hours and with feeding cues.  Mom made aware of O/P services, breastfeeding support groups, community resources, and our phone # for post-discharge questions.     Maternal Data Has patient been taught Hand Expression?: Yes Does the patient have breastfeeding experience prior to this delivery?: Yes  Feeding Feeding Type: Breast Fed Length of feed: 20 min  LATCH Score                   Interventions    Lactation Tools Discussed/Used     Consult Status Consult Status: Follow-up Date: 05/23/17 Follow-up type: In-patient    [name], Clifford [facility] 05/22/2017, 7:30 PM

## 2017-05-23 ENCOUNTER — Other Ambulatory Visit: Payer: Self-pay

## 2017-05-23 MED ORDER — IBUPROFEN 600 MG PO TABS: 600.0000 mg | ORAL_TABLET | Freq: Four times a day (QID) | ORAL | 0 refills | Status: AC

## 2017-05-23 NOTE — Discharge Summary (Signed)
Obstetric Discharge Summary Reason for Admission: onset of labor Prenatal Procedures: none Intrapartum Procedures: spontaneous vaginal delivery Postpartum Procedures: none Complications-Operative and Postpartum: none Hemoglobin  Date Value Ref Range Status  05/22/2017 11.8 (L) 12.0 - 15.0 g/dL Final   HCT  Date Value Ref Range Status  05/22/2017 37.1 36.0 - 46.0 % Final    Physical Exam:  General: alert Lochia: appropriate Uterine Fundus: firm Incision: healing well DVT Evaluation: No evidence of DVT seen on physical exam.  Discharge Diagnoses: Term Pregnancy-delivered  Discharge Information: Date: 05/23/2017 Activity: pelvic rest Diet: routine Medications: PNV and Ibuprofen Condition: stable Instructions: refer to practice specific booklet Discharge to: home Follow-up Information    [place] Schedule an appointment as soon as possible for a visit in 6 week(s).   Contact information: [address] [place] [address] [phone]           Newborn Data: Live born female  Birth Weight: 6 lb 8 oz (2948 g) APGAR: 7, 8  Newborn Delivery   Birth date/time:  05/22/2017 00:32:00 Delivery type:  Vaginal, Spontaneous     Home with mother.  Kailah Pennel Milana ObeyM Zohaib Heeney 05/23/2017, 8:55 AM

## 2017-05-23 NOTE — Lactation Note (Signed)
This note was copied from a baby's chart. Lactation Consultation Note  Patient Name: Ellen Reese ZOXWR'UToday's Date: 05/23/2017 Reason for consult: Follow-up assessment   Mother denies problems or concerns. Mom encouraged to feed baby 8-12 times/24 hours and with feeding cues.  Reviewed engorgement care and monitoring voids/stools.    Maternal Data    Feeding Feeding Type: Breast Fed Length of feed: 15 min  LATCH Score                   Interventions    Lactation Tools Discussed/Used     Consult Status Consult Status: Complete    Hardie PulleyBerkelhammer, Emmelyn Schmale Boschen 05/23/2017, 12:43 PM

## 2017-06-01 ENCOUNTER — Inpatient Hospital Stay (HOSPITAL_COMMUNITY): Payer: 59

## 2018-05-04 LAB — OB RESULTS CONSOLE ABO/RH: "RH Type ": POSITIVE

## 2018-05-04 LAB — OB RESULTS CONSOLE HIV ANTIBODY (ROUTINE TESTING): HIV: NONREACTIVE

## 2018-05-04 LAB — OB RESULTS CONSOLE GC/CHLAMYDIA
Chlamydia: NEGATIVE
Gonorrhea: NEGATIVE

## 2018-05-04 LAB — OB RESULTS CONSOLE RPR: RPR: NONREACTIVE

## 2018-05-04 LAB — OB RESULTS CONSOLE HEPATITIS B SURFACE ANTIGEN: Hepatitis B Surface Ag: NEGATIVE

## 2018-05-04 LAB — OB RESULTS CONSOLE ANTIBODY SCREEN: Antibody Screen: NEGATIVE

## 2018-05-04 LAB — OB RESULTS CONSOLE RUBELLA ANTIBODY, IGM: Rubella: IMMUNE

## 2018-05-08 DIAGNOSIS — N911 Secondary amenorrhea: Secondary | ICD-10-CM | POA: Diagnosis not present

## 2018-05-15 DIAGNOSIS — Z3481 Encounter for supervision of other normal pregnancy, first trimester: Secondary | ICD-10-CM | POA: Diagnosis not present

## 2018-05-15 DIAGNOSIS — Z3685 Encounter for antenatal screening for Streptococcus B: Secondary | ICD-10-CM | POA: Diagnosis not present

## 2018-05-15 DIAGNOSIS — O26841 Uterine size-date discrepancy, first trimester: Secondary | ICD-10-CM | POA: Diagnosis not present

## 2018-05-15 DIAGNOSIS — Z3400 Encounter for supervision of normal first pregnancy, unspecified trimester: Secondary | ICD-10-CM | POA: Diagnosis not present

## 2018-05-15 DIAGNOSIS — Z3482 Encounter for supervision of other normal pregnancy, second trimester: Secondary | ICD-10-CM | POA: Diagnosis not present

## 2018-05-15 DIAGNOSIS — Z3A11 11 weeks gestation of pregnancy: Secondary | ICD-10-CM | POA: Diagnosis not present

## 2018-05-15 DIAGNOSIS — Z113 Encounter for screening for infections with a predominantly sexual mode of transmission: Secondary | ICD-10-CM | POA: Diagnosis not present

## 2018-05-30 DIAGNOSIS — O26841 Uterine size-date discrepancy, first trimester: Secondary | ICD-10-CM | POA: Diagnosis not present

## 2018-05-30 DIAGNOSIS — Z113 Encounter for screening for infections with a predominantly sexual mode of transmission: Secondary | ICD-10-CM | POA: Diagnosis not present

## 2018-05-30 DIAGNOSIS — Z3A11 11 weeks gestation of pregnancy: Secondary | ICD-10-CM | POA: Diagnosis not present

## 2018-05-30 DIAGNOSIS — Z3400 Encounter for supervision of normal first pregnancy, unspecified trimester: Secondary | ICD-10-CM | POA: Diagnosis not present

## 2018-06-12 DIAGNOSIS — Z3A12 12 weeks gestation of pregnancy: Secondary | ICD-10-CM | POA: Diagnosis not present

## 2018-06-12 DIAGNOSIS — Z3682 Encounter for antenatal screening for nuchal translucency: Secondary | ICD-10-CM | POA: Diagnosis not present

## 2018-06-13 DIAGNOSIS — J0100 Acute maxillary sinusitis, unspecified: Secondary | ICD-10-CM | POA: Diagnosis not present

## 2018-06-13 DIAGNOSIS — Z3A12 12 weeks gestation of pregnancy: Secondary | ICD-10-CM | POA: Diagnosis not present

## 2018-06-13 DIAGNOSIS — Z331 Pregnant state, incidental: Secondary | ICD-10-CM | POA: Diagnosis not present

## 2018-06-13 DIAGNOSIS — Z3482 Encounter for supervision of other normal pregnancy, second trimester: Secondary | ICD-10-CM | POA: Diagnosis not present

## 2018-06-13 DIAGNOSIS — J029 Acute pharyngitis, unspecified: Secondary | ICD-10-CM | POA: Diagnosis not present

## 2018-07-20 DIAGNOSIS — Z363 Encounter for antenatal screening for malformations: Secondary | ICD-10-CM | POA: Diagnosis not present

## 2018-07-20 DIAGNOSIS — Z3A18 18 weeks gestation of pregnancy: Secondary | ICD-10-CM | POA: Diagnosis not present

## 2018-09-14 DIAGNOSIS — Z3480 Encounter for supervision of other normal pregnancy, unspecified trimester: Secondary | ICD-10-CM | POA: Diagnosis not present

## 2018-09-14 DIAGNOSIS — Z23 Encounter for immunization: Secondary | ICD-10-CM | POA: Diagnosis not present

## 2018-11-16 DIAGNOSIS — Z369 Encounter for antenatal screening, unspecified: Secondary | ICD-10-CM | POA: Diagnosis not present

## 2018-11-16 LAB — OB RESULTS CONSOLE GBS: GBS: NEGATIVE

## 2018-11-22 DIAGNOSIS — Z3A36 36 weeks gestation of pregnancy: Secondary | ICD-10-CM | POA: Diagnosis not present

## 2018-11-22 DIAGNOSIS — O26843 Uterine size-date discrepancy, third trimester: Secondary | ICD-10-CM | POA: Diagnosis not present

## 2018-11-30 ENCOUNTER — Telehealth (HOSPITAL_COMMUNITY): Payer: Self-pay | Admitting: *Deleted

## 2018-11-30 ENCOUNTER — Encounter (HOSPITAL_COMMUNITY): Payer: Self-pay | Admitting: *Deleted

## 2018-11-30 NOTE — Telephone Encounter (Signed)
Preadmission screen  

## 2018-12-07 ENCOUNTER — Other Ambulatory Visit: Payer: Self-pay

## 2018-12-07 ENCOUNTER — Encounter (HOSPITAL_COMMUNITY): Payer: Self-pay

## 2018-12-07 ENCOUNTER — Inpatient Hospital Stay (HOSPITAL_COMMUNITY)
Admission: AD | Admit: 2018-12-07 | Discharge: 2018-12-08 | DRG: 807 | Disposition: A | Discharge status: Home / Self Care | Payer: BC Managed Care – PPO | Attending: Obstetrics and Gynecology | Admitting: Obstetrics and Gynecology

## 2018-12-07 DIAGNOSIS — Z3A38 38 weeks gestation of pregnancy: Secondary | ICD-10-CM

## 2018-12-07 DIAGNOSIS — R030 Elevated blood-pressure reading, without diagnosis of hypertension: Secondary | ICD-10-CM

## 2018-12-07 DIAGNOSIS — Z412 Encounter for routine and ritual male circumcision: Secondary | ICD-10-CM | POA: Diagnosis not present

## 2018-12-07 DIAGNOSIS — O26893 Other specified pregnancy related conditions, third trimester: Principal | ICD-10-CM | POA: Diagnosis present

## 2018-12-07 DIAGNOSIS — Z1159 Encounter for screening for other viral diseases: Secondary | ICD-10-CM

## 2018-12-07 DIAGNOSIS — Z23 Encounter for immunization: Secondary | ICD-10-CM | POA: Diagnosis not present

## 2018-12-07 DIAGNOSIS — O4290 Premature rupture of membranes, unspecified as to length of time between rupture and onset of labor, unspecified weeks of gestation: Secondary | ICD-10-CM

## 2018-12-07 LAB — RPR: RPR Ser Ql: NONREACTIVE

## 2018-12-07 LAB — COMPREHENSIVE METABOLIC PANEL
ALT: 52 U/L — ABNORMAL HIGH (ref 0–44)
AST: 35 U/L (ref 15–41)
Albumin: 3 g/dL — ABNORMAL LOW (ref 3.5–5.0)
Alkaline Phosphatase: 150 U/L — ABNORMAL HIGH (ref 38–126)
Anion gap: 18 — ABNORMAL HIGH (ref 5–15)
BUN: 7 mg/dL (ref 6–20)
CO2: 15 mmol/L — ABNORMAL LOW (ref 22–32)
Calcium: 9.2 mg/dL (ref 8.9–10.3)
Chloride: 105 mmol/L (ref 98–111)
Creatinine, Ser: 0.81 mg/dL (ref 0.44–1.00)
GFR calc Af Amer: >60 mL/min (ref >60)
GFR calc non Af Amer: >60 mL/min (ref >60)
Glucose, Bld: 107 mg/dL — ABNORMAL HIGH (ref 70–99)
Potassium: 3.6 mmol/L (ref 3.5–5.1)
Sodium: 138 mmol/L (ref 135–145)
Total Bilirubin: 0.5 mg/dL (ref 0.3–1.2)
Total Protein: 6.7 g/dL (ref 6.5–8.1)

## 2018-12-07 LAB — PROTEIN / CREATININE RATIO, URINE
Creatinine, Urine: 92.04 mg/dL
Protein Creatinine Ratio: 0.27 mg/mg{Cre} — ABNORMAL HIGH (ref 0.00–0.15)
Total Protein, Urine: 25 mg/dL

## 2018-12-07 LAB — CBC
HCT: 45.6 % (ref 36.0–46.0)
Hemoglobin: 13.4 g/dL (ref 12.0–15.0)
MCH: 22.1 pg — ABNORMAL LOW (ref 26.0–34.0)
MCHC: 29.4 g/dL — ABNORMAL LOW (ref 30.0–36.0)
MCV: 75.4 fL — ABNORMAL LOW (ref 80.0–100.0)
Platelets: 267 10*3/uL (ref 150–400)
RBC: 6.05 MIL/uL — ABNORMAL HIGH (ref 3.87–5.11)
RDW: 16.5 % — ABNORMAL HIGH (ref 11.5–15.5)
WBC: 9.4 10*3/uL (ref 4.0–10.5)
nRBC: 0 % (ref 0.0–0.2)

## 2018-12-07 LAB — TYPE AND SCREEN
ABO/RH(D): AB POS
Antibody Screen: NEGATIVE

## 2018-12-07 LAB — POCT FERN TEST
POCT Fern Test: POSITIVE
POCT Fern Test: POSITIVE

## 2018-12-07 LAB — SARS CORONAVIRUS 2 BY RT PCR (HOSPITAL ORDER, PERFORMED IN ~~LOC~~ HOSPITAL LAB): SARS Coronavirus 2: NEGATIVE

## 2018-12-07 MED ORDER — OXYTOCIN 40 UNITS IN NORMAL SALINE INFUSION - SIMPLE MED
2.5000 [IU]/h | INTRAVENOUS | Status: DC
  Administered 2018-12-07: 2.5 [IU]/h via INTRAVENOUS
  Filled 2018-12-07: qty 1000

## 2018-12-07 MED ORDER — ONDANSETRON HCL 4 MG/2ML IJ SOLN: 4.0000 mg | Freq: Four times a day (QID) | INTRAMUSCULAR | Status: DC | PRN

## 2018-12-07 MED ORDER — ONDANSETRON HCL 4 MG PO TABS: 4.0000 mg | ORAL_TABLET | ORAL | Status: DC | PRN

## 2018-12-07 MED ORDER — PRENATAL MULTIVITAMIN CH
1.0000 | ORAL_TABLET | Freq: Every day | ORAL | Status: DC
  Administered 2018-12-07 – 2018-12-08 (×2): 1 via ORAL
  Filled 2018-12-07 (×2): qty 1

## 2018-12-07 MED ORDER — LIDOCAINE HCL (PF) 1 % IJ SOLN: 30.0000 mL | INTRAMUSCULAR | Status: DC | PRN

## 2018-12-07 MED ORDER — OXYCODONE-ACETAMINOPHEN 5-325 MG PO TABS: 1.0000 | ORAL_TABLET | ORAL | Status: DC | PRN

## 2018-12-07 MED ORDER — SENNOSIDES-DOCUSATE SODIUM 8.6-50 MG PO TABS
2.0000 | ORAL_TABLET | ORAL | Status: DC
  Administered 2018-12-07: 2 via ORAL
  Filled 2018-12-07: qty 2

## 2018-12-07 MED ORDER — TETANUS-DIPHTH-ACELL PERTUSSIS 5-2.5-18.5 LF-MCG/0.5 IM SUSP: 0.5000 mL | Freq: Once | INTRAMUSCULAR | Status: DC

## 2018-12-07 MED ORDER — WITCH HAZEL-GLYCERIN EX PADS: 1.0000 "application " | MEDICATED_PAD | CUTANEOUS | Status: DC | PRN

## 2018-12-07 MED ORDER — BENZOCAINE-MENTHOL 20-0.5 % EX AERO: 1.0000 "application " | INHALATION_SPRAY | CUTANEOUS | Status: DC | PRN

## 2018-12-07 MED ORDER — LACTATED RINGERS IV SOLN: INTRAVENOUS | Status: DC

## 2018-12-07 MED ORDER — OXYCODONE-ACETAMINOPHEN 5-325 MG PO TABS
1.0000 | ORAL_TABLET | ORAL | Status: DC | PRN
  Administered 2018-12-07: 1 via ORAL
  Filled 2018-12-07: qty 1

## 2018-12-07 MED ORDER — OXYCODONE-ACETAMINOPHEN 5-325 MG PO TABS: 2.0000 | ORAL_TABLET | ORAL | Status: DC | PRN

## 2018-12-07 MED ORDER — MEASLES, MUMPS & RUBELLA VAC IJ SOLR: 0.5000 mL | Freq: Once | INTRAMUSCULAR | Status: DC

## 2018-12-07 MED ORDER — MEDROXYPROGESTERONE ACETATE 150 MG/ML IM SUSP: 150.0000 mg | INTRAMUSCULAR | Status: DC | PRN

## 2018-12-07 MED ORDER — COCONUT OIL OIL: 1.0000 "application " | TOPICAL_OIL | Status: DC | PRN

## 2018-12-07 MED ORDER — BUTORPHANOL TARTRATE 1 MG/ML IJ SOLN
1.0000 mg | Freq: Once | INTRAMUSCULAR | Status: DC
  Filled 2018-12-07: qty 1

## 2018-12-07 MED ORDER — SOD CITRATE-CITRIC ACID 500-334 MG/5ML PO SOLN: 30.0000 mL | ORAL | Status: DC | PRN

## 2018-12-07 MED ORDER — LACTATED RINGERS IV SOLN: 500.0000 mL | INTRAVENOUS | Status: DC | PRN

## 2018-12-07 MED ORDER — ACETAMINOPHEN 325 MG PO TABS: 650.0000 mg | ORAL_TABLET | ORAL | Status: DC | PRN

## 2018-12-07 MED ORDER — DIBUCAINE (PERIANAL) 1 % EX OINT: 1.0000 "application " | TOPICAL_OINTMENT | CUTANEOUS | Status: DC | PRN

## 2018-12-07 MED ORDER — ONDANSETRON HCL 4 MG/2ML IJ SOLN: 4.0000 mg | INTRAMUSCULAR | Status: DC | PRN

## 2018-12-07 MED ORDER — IBUPROFEN 600 MG PO TABS
600.0000 mg | ORAL_TABLET | Freq: Four times a day (QID) | ORAL | Status: DC
  Administered 2018-12-07 – 2018-12-08 (×6): 600 mg via ORAL
  Filled 2018-12-07 (×6): qty 1

## 2018-12-07 MED ORDER — OXYTOCIN BOLUS FROM INFUSION
500.0000 mL | Freq: Once | INTRAVENOUS | Status: AC
  Administered 2018-12-07: 500 mL via INTRAVENOUS

## 2018-12-07 MED ORDER — FLEET ENEMA 7-19 GM/118ML RE ENEM: 1.0000 | ENEMA | RECTAL | Status: DC | PRN

## 2018-12-07 MED ORDER — SIMETHICONE 80 MG PO CHEW: 80.0000 mg | CHEWABLE_TABLET | ORAL | Status: DC | PRN

## 2018-12-07 MED ORDER — DIPHENHYDRAMINE HCL 25 MG PO CAPS: 25.0000 mg | ORAL_CAPSULE | Freq: Four times a day (QID) | ORAL | Status: DC | PRN

## 2018-12-07 NOTE — H&P (Signed)
Ellen Reese is a 28 y.o. female presenting for SOL.  Pregnancy uncomplicated.    OB History    Gravida  5   Para  2   Term  2   Preterm      AB  2   Living  2     SAB      TAB  2   Ectopic      Multiple  0   Live Births  2          Past Medical History:  Diagnosis Date  . Medical history non-contributory    Past Surgical History:  Procedure Laterality Date  . WISDOM TOOTH EXTRACTION     Family History: family history includes Breast cancer in her paternal grandmother; Hypertension in her maternal grandfather; Prostate cancer in her maternal grandfather; Stomach cancer in her maternal grandmother. Social History:  reports that she has never smoked. She has never used smokeless tobacco. She reports that she does not drink alcohol or use drugs.     Maternal Diabetes: No Genetic Screening: Normal Maternal Ultrasounds/Referrals: Normal Fetal Ultrasounds or other Referrals:  None Maternal Substance Abuse:  No Significant Maternal Medications:  None Significant Maternal Lab Results:  Group B Strep negative Other Comments:  None  ROS History Dilation: 10 Effacement (%): 90 Station: -2 Exam by:: Dr. Julien Girt Blood pressure 134/87, pulse (!) 106, temperature 99.1 F (37.3 C), temperature source Oral, resp. rate 18, SpO2 100 %, unknown if currently breastfeeding. Exam Physical Exam  Prenatal labs: ABO, Rh: AB/Positive/-- (12/19 0000) Antibody: Negative (12/19 0000) Rubella: Immune (12/19 0000) RPR: Nonreactive (12/19 0000)  HBsAg: Negative (12/19 0000)  HIV: Non-reactive (12/19 0000)  GBS: Negative (07/02 0000)   Assessment/Plan: Admit Exp mngt   Ellen Reese 12/07/2018, 3:16 AM

## 2018-12-07 NOTE — Lactation Note (Signed)
This note was copied from a baby's chart. Lactation Consultation Note  Patient Name: Ellen Reese [id number]'B Date: 12/07/2018 Reason for consult: Initial assessment;Early term 23-38.6wks  P3 mother whose infant is now 21 hours old.  Mother breast fed her other two children for one year each.  The youngest one is now 13 months old.  Baby was asleep in mother's arms when I arrived.  He has recently been circumcised.  Informed mother of what to expect with breast feeding after circumcision.    Mother stated that baby had been having some difficulty latching to the left breast but she feels like he has improved with the last feeding.  Mother's nipples are large and she knows about obtaining a wide open gape and flanged lips.  I asked her to call her RN/LC for assistance with latching at the next feed if desired.  She will continue to feed 8-12 times/24 hours or sooner if baby shows cues.  Mother is able to express colostrum and feeds this back to baby.  Colostrum container provided and milk storage times reviewed.  Finger feeding demonstrated. Mom made aware of O/P services, breastfeeding support groups, community resources, and our phone # for post-discharge questions.   Mother has a DEBP for home use and will call for assistance as needed.   Maternal Data Formula Feeding for Exclusion: No Has patient been taught Hand Expression?: Yes Does the patient have breastfeeding experience prior to this delivery?: Yes  Feeding    LATCH Score                   Interventions    Lactation Tools Discussed/Used     Consult Status Consult Status: Follow-up Date: 12/08/18 Follow-up type: In-patient    Chistine Dematteo R Evangelos Paulino 12/07/2018, 4:18 PM

## 2018-12-07 NOTE — MAU Note (Signed)
Presents with cxts approx 2-10 min apart since 2200 last night. No LOF. Pt lost mucous plug yesterday and today. Pos FM.  Gilmer Mor RN

## 2018-12-07 NOTE — Progress Notes (Signed)
SVD of vigorous female infant w/ apgars of 9,9.  Placenta delivered spontaneous w/ 3VC.   No lacerations Fundus firm.

## 2018-12-07 NOTE — MAU Provider Note (Signed)
Chief Complaint  Patient presents with  . Contractions     First Provider Initiated Contact with Patient 12/07/18 0116      S: Ellen Reese  is a 28 y.o. y.o. year old G17P2022 female at [redacted]w[redacted]d weeks gestation who presents to [facility]U for labor evaluation & has elevated blood pressures. Denies Hx of hypertension.   Associated symptoms: denies Headache, denies vision changes, denies epigastric pain Contractions: reports painful contractions Vaginal bleeding: denies Fetal movement: good  O:  Patient Vitals for the past 24 hrs:  BP Temp Temp src Pulse Resp SpO2  12/07/18 0134 134/87 - - (!) 106 - -  12/07/18 0115 (!) 149/103 - - (!) 109 - -  12/07/18 0059 (!) 141/95 99 F (37.2 C) Oral (!) 115 18 100 %   General: NAD Heart: Regular rate Lungs: Normal rate and effort Abd: Soft, NT, Gravid, S=D Extremities: no pitting Pedal edema Neuro: 2+ deep tendon reflexes, No clonus Pelvic: NEFG, no bleeding or LOF.   Dilation: 4.5 Effacement (%): 90 Cervical Position: Middle Station: -2 Presentation: Vertex Exam by:: Iven Finn  NST:  Baseline: 145 bpm, Variability: Good {> 6 bpm), Accelerations: 10x10 and Decelerations: Early  Results for orders placed or performed during the hospital encounter of 12/07/18 (from the past 24 hour(s))  POCT fern test     Status: Abnormal   Collection Time: 12/07/18  1:38 AM  Result Value Ref Range   POCT Fern Test Positive = ruptured amniotic membanes     A:  1. Elevated BP without diagnosis of hypertension  -elevated BPs, not severe range. Pt denies hx & no symptoms. PEC labs ordered  2. [redacted] weeks gestation of pregnancy   3. Amniotic fluid leaking  -pt reports leaking fluid started during her exam. Small amount of clear fluid on perineum. Fern positive     P:  RN to call Dr. Julien Girt for admission orders since patient spontaneously ruptured while in MAU PEC labs pending  Jorje Guild, NP 12/07/2018 1:45 AM

## 2018-12-07 NOTE — Progress Notes (Signed)
Post Partum Day 0 (labor) Subjective: no complaints, up ad lib, voiding, tolerating PO and + flatus  Objective: Blood pressure 123/71, pulse 75, temperature 98.6 F (37 C), temperature source Oral, resp. rate 18, SpO2 100 %, unknown if currently breastfeeding.  Physical Exam:  General: alert, cooperative and appears stated age Lochia: appropriate Uterine Fundus: firm Incision: N/A DVT Evaluation: No evidence of DVT seen on physical exam. Negative Homan's sign. No cords or calf tenderness. No significant calf/ankle edema.  Recent Labs    12/07/18 0324  HGB 13.4  HCT 45.6    Assessment/Plan: Plan for discharge tomorrow, Breastfeeding and Circumcision prior to discharge  D/W patient female infant circumcision, risks/benefits reviewed. All questions answered.    LOS: 0 days    Tyson Dense 12/07/2018, 8:55 AM

## 2018-12-08 LAB — ABO/RH: ABO/RH(D): AB POS

## 2018-12-08 NOTE — Discharge Summary (Signed)
Obstetric Discharge Summary Reason for Admission: onset of labor Prenatal Procedures: none Intrapartum Procedures: spontaneous vaginal delivery Postpartum Procedures: none Complications-Operative and Postpartum: none Hemoglobin  Date Value Ref Range Status  12/07/2018 13.4 12.0 - 15.0 g/dL Final   HCT  Date Value Ref Range Status  12/07/2018 45.6 36.0 - 46.0 % Final    Physical Exam:  General: alert, cooperative, appears stated age and no distress Lochia: appropriate Uterine Fundus: firm Incision: healing well DVT Evaluation: No evidence of DVT seen on physical exam.  Discharge Diagnoses: Term Pregnancy-delivered  Discharge Information: Date: 12/08/2018 Activity: pelvic rest Diet: routine Medications: PNV and Ibuprofen Condition: stable Instructions: refer to practice specific booklet Discharge to: home   Newborn Data: Live born female  Birth Weight: 7 lb 9.9 oz (3456 g) APGAR: 9, 9  Newborn Delivery   Birth date/time: 12/07/2018 02:48:00 Delivery type: Vaginal, Spontaneous      Home with mother.  Ellen Reese 12/08/2018, 9:13 AM

## 2018-12-08 NOTE — Lactation Note (Signed)
This note was copied from a baby's chart. Lactation Consultation Note  Patient Name: Ellen Reese [id number] Date: 12/08/2018 Reason for consult: Follow-up assessment Baby is 30 hours old/6% weight loss.  Baby won't be discharged today to monitor bilirubin.  Mom feels feedings are going well although she is still working on obtaining good depth.  Encouraged frequent feedings today using good breast massage and compression.  Instructed to feed with cues and call for assist prn.  Maternal Data    Feeding Feeding Type: Breast Fed  LATCH Score                   Interventions    Lactation Tools Discussed/Used     Consult Status Consult Status: Follow-up Date: 12/09/18 Follow-up type: In-patient    Ave Filter 12/08/2018, 9:08 AM

## 2018-12-09 ENCOUNTER — Ambulatory Visit: Payer: Self-pay

## 2018-12-09 NOTE — Lactation Note (Signed)
This note was copied from a baby's chart. Lactation Consultation Note:  Infant 72 hours old at 4% wt loss.  Arrived in mother's room to see her in laid back breastfeeding positions. Infant tugging with strong tug but shallow. Mother reports discomfort . She reports that she is getting him on deeper but she can tell at times that he is shallow.  Observed that mothers nipples are without trauma.   Mother was given a harmony hand pump with a #27 flange.  Mother has comfort gels . Mother reports that her Rt breast is swollen and firm. She has already started with ice on her breast.  Reviewed S/S of Mastitis.  Discussed treatment and prevention of engorgement.  Mother to continue to breastfeed infant on cue and to feed infant 8-12 times in 25 hours. Mother recepitve to all teaching.   Patient Name: Ellen Reese XMIWO'E Date: 12/09/2018 Reason for consult: Follow-up assessment   Maternal Data    Feeding Feeding Type: Breast Fed  LATCH Score Latch: Grasps breast easily, tongue down, lips flanged, rhythmical sucking.  Audible Swallowing: A few with stimulation  Type of Nipple: Everted at rest and after stimulation  Comfort (Breast/Nipple): Filling, red/small blisters or bruises, mild/mod discomfort  Hold (Positioning): No assistance needed to correctly position infant at breast.  LATCH Score: 8  Interventions Interventions: Hand pump  Lactation Tools Discussed/Used     Consult Status Consult Status: Complete    Darla Lesches 12/09/2018, 9:17 AM

## 2018-12-12 ENCOUNTER — Other Ambulatory Visit (HOSPITAL_COMMUNITY)
Admission: RE | Admit: 2018-12-12 | Discharge: 2018-12-12 | Disposition: A | Discharge status: Home / Self Care | Payer: BC Managed Care – PPO | Source: Ambulatory Visit | Attending: Obstetrics and Gynecology | Admitting: Obstetrics and Gynecology

## 2018-12-14 ENCOUNTER — Inpatient Hospital Stay (HOSPITAL_COMMUNITY): Payer: BC Managed Care – PPO

## 2019-01-15 DIAGNOSIS — Z1389 Encounter for screening for other disorder: Secondary | ICD-10-CM | POA: Diagnosis not present

## 2019-03-08 DIAGNOSIS — N6310 Unspecified lump in the right breast, unspecified quadrant: Secondary | ICD-10-CM | POA: Diagnosis not present

## 2019-03-09 ENCOUNTER — Other Ambulatory Visit: Payer: Self-pay | Admitting: Obstetrics & Gynecology

## 2019-03-09 DIAGNOSIS — N6310 Unspecified lump in the right breast, unspecified quadrant: Secondary | ICD-10-CM

## 2019-03-17 ENCOUNTER — Other Ambulatory Visit: Payer: Self-pay

## 2019-03-17 DIAGNOSIS — Z20822 Contact with and (suspected) exposure to covid-19: Secondary | ICD-10-CM

## 2019-03-18 LAB — NOVEL CORONAVIRUS, NAA: SARS-CoV-2, NAA: NOT DETECTED

## 2019-03-19 ENCOUNTER — Other Ambulatory Visit: Payer: Self-pay

## 2019-03-19 ENCOUNTER — Ambulatory Visit
Admission: RE | Admit: 2019-03-19 | Discharge: 2019-03-19 | Disposition: A | Discharge status: Home / Self Care | Payer: BC Managed Care – PPO | Source: Ambulatory Visit | Attending: Obstetrics & Gynecology | Admitting: Obstetrics & Gynecology

## 2019-03-19 DIAGNOSIS — N6310 Unspecified lump in the right breast, unspecified quadrant: Secondary | ICD-10-CM

## 2019-03-19 DIAGNOSIS — N6311 Unspecified lump in the right breast, upper outer quadrant: Secondary | ICD-10-CM | POA: Diagnosis not present

## 2019-05-15 ENCOUNTER — Ambulatory Visit: Payer: BC Managed Care – PPO | Attending: Internal Medicine

## 2019-05-15 DIAGNOSIS — Z20828 Contact with and (suspected) exposure to other viral communicable diseases: Secondary | ICD-10-CM | POA: Diagnosis not present

## 2019-05-15 DIAGNOSIS — Z20822 Contact with and (suspected) exposure to covid-19: Secondary | ICD-10-CM

## 2019-05-16 ENCOUNTER — Other Ambulatory Visit: Payer: BC Managed Care – PPO

## 2019-05-16 LAB — NOVEL CORONAVIRUS, NAA: SARS-CoV-2, NAA: NOT DETECTED

## 2019-08-25 ENCOUNTER — Ambulatory Visit: Payer: BC Managed Care – PPO

## 2019-08-30 ENCOUNTER — Ambulatory Visit: Payer: BC Managed Care – PPO | Attending: Internal Medicine

## 2019-08-30 DIAGNOSIS — Z23 Encounter for immunization: Secondary | ICD-10-CM

## 2019-08-30 NOTE — Progress Notes (Signed)
   Covid-19 Vaccination Clinic  Name:  Ellen Reese    MRN: [id number] DOB: Jun 08, 1990  08/30/2019  [name] was observed post Covid-19 immunization for 15 minutes without incident. She was provided with Vaccine Information Sheet and instruction to access the V-Safe system.   [name] was instructed to call 911 with any severe reactions post vaccine: Marland Kitchen Difficulty breathing  . Swelling of face and throat  . A fast heartbeat  . A bad rash all over body  . Dizziness and weakness   Immunizations Administered    Name Date Dose VIS Date Route   Pfizer COVID-19 Vaccine 08/30/2019  2:12 PM 0.3 mL 04/27/2019 Intramuscular   Manufacturer: [employer], [employer]   Lot: [id number]   NDC: [address]-3

## 2019-09-25 ENCOUNTER — Ambulatory Visit: Payer: BC Managed Care – PPO | Attending: Internal Medicine

## 2019-09-25 DIAGNOSIS — Z23 Encounter for immunization: Secondary | ICD-10-CM

## 2019-09-25 NOTE — Progress Notes (Signed)
   Covid-19 Vaccination Clinic  Name:  Ellen Reese    MRN: [id number] DOB: 1990-11-28  09/25/2019  [name] was observed post Covid-19 immunization for 15 minutes without incident. She was provided with Vaccine Information Sheet and instruction to access the V-Safe system.   [name] was instructed to call 911 with any severe reactions post vaccine: Marland Kitchen Difficulty breathing  . Swelling of face and throat  . A fast heartbeat  . A bad rash all over body  . Dizziness and weakness   Immunizations Administered    Name Date Dose VIS Date Route   Pfizer COVID-19 Vaccine 09/25/2019  2:11 PM 0.3 mL 07/11/2018 Intramuscular   Manufacturer: [employer], [employer]   Lot: [id number]   NDC: [address]-8

## 2020-05-15 ENCOUNTER — Ambulatory Visit: Payer: BC Managed Care – PPO

## 2022-05-19 DIAGNOSIS — H5213 Myopia, bilateral: Secondary | ICD-10-CM | POA: Diagnosis not present

## 2022-06-09 ENCOUNTER — Other Ambulatory Visit: Payer: Self-pay

## 2022-06-09 MED ORDER — WEGOVY 1 MG/0.5ML ~~LOC~~ SOAJ: 1.0000 mg | SUBCUTANEOUS | 0 refills | Status: AC

## 2022-06-09 MED ORDER — WEGOVY 0.25 MG/0.5ML ~~LOC~~ SOAJ
0.2500 mg | SUBCUTANEOUS | 0 refills | Status: AC
  Filled 2022-06-09 – 2022-06-10 (×3): qty 2, 28d supply, fill #0

## 2022-06-09 MED ORDER — WEGOVY 0.5 MG/0.5ML ~~LOC~~ SOAJ: 0.5000 mg | SUBCUTANEOUS | 0 refills | Status: AC

## 2022-06-10 ENCOUNTER — Other Ambulatory Visit: Payer: Self-pay

## 2022-06-23 DIAGNOSIS — Z6833 Body mass index (BMI) 33.0-33.9, adult: Secondary | ICD-10-CM | POA: Diagnosis not present

## 2022-06-23 DIAGNOSIS — R7303 Prediabetes: Secondary | ICD-10-CM | POA: Diagnosis not present

## 2022-06-23 DIAGNOSIS — E669 Obesity, unspecified: Secondary | ICD-10-CM | POA: Diagnosis not present

## 2022-06-29 ENCOUNTER — Other Ambulatory Visit: Payer: Self-pay

## 2022-07-07 DIAGNOSIS — E669 Obesity, unspecified: Secondary | ICD-10-CM | POA: Diagnosis not present

## 2022-07-07 DIAGNOSIS — Z6833 Body mass index (BMI) 33.0-33.9, adult: Secondary | ICD-10-CM | POA: Diagnosis not present

## 2022-07-07 DIAGNOSIS — R7303 Prediabetes: Secondary | ICD-10-CM | POA: Diagnosis not present

## 2022-07-13 ENCOUNTER — Other Ambulatory Visit: Payer: Self-pay

## 2022-07-19 ENCOUNTER — Other Ambulatory Visit: Payer: Self-pay

## 2022-08-03 DIAGNOSIS — E669 Obesity, unspecified: Secondary | ICD-10-CM | POA: Diagnosis not present

## 2022-08-03 DIAGNOSIS — Z6832 Body mass index (BMI) 32.0-32.9, adult: Secondary | ICD-10-CM | POA: Diagnosis not present

## 2022-08-03 DIAGNOSIS — R7303 Prediabetes: Secondary | ICD-10-CM | POA: Diagnosis not present

## 2022-08-25 DIAGNOSIS — E669 Obesity, unspecified: Secondary | ICD-10-CM | POA: Diagnosis not present

## 2022-08-25 DIAGNOSIS — R7303 Prediabetes: Secondary | ICD-10-CM | POA: Diagnosis not present

## 2022-08-25 DIAGNOSIS — Z6832 Body mass index (BMI) 32.0-32.9, adult: Secondary | ICD-10-CM | POA: Diagnosis not present

## 2023-02-21 ENCOUNTER — Encounter: Payer: Self-pay | Admitting: Emergency Medicine

## 2023-02-21 ENCOUNTER — Emergency Department: Payer: 59

## 2023-02-21 ENCOUNTER — Emergency Department
Admission: EM | Admit: 2023-02-21 | Discharge: 2023-02-21 | Disposition: A | Discharge status: Home / Self Care | Payer: 59 | Attending: Emergency Medicine | Admitting: Emergency Medicine

## 2023-02-21 ENCOUNTER — Other Ambulatory Visit: Payer: Self-pay

## 2023-02-21 DIAGNOSIS — R109 Unspecified abdominal pain: Secondary | ICD-10-CM | POA: Insufficient documentation

## 2023-02-21 DIAGNOSIS — M549 Dorsalgia, unspecified: Secondary | ICD-10-CM | POA: Insufficient documentation

## 2023-02-21 DIAGNOSIS — N200 Calculus of kidney: Secondary | ICD-10-CM | POA: Diagnosis not present

## 2023-02-21 DIAGNOSIS — M791 Myalgia, unspecified site: Secondary | ICD-10-CM | POA: Diagnosis not present

## 2023-02-21 DIAGNOSIS — M545 Low back pain, unspecified: Secondary | ICD-10-CM | POA: Diagnosis not present

## 2023-02-21 LAB — COMPREHENSIVE METABOLIC PANEL
ALT: 42 U/L (ref 0–44)
AST: 24 U/L (ref 15–41)
Albumin: 4.2 g/dL (ref 3.5–5.0)
Alkaline Phosphatase: 73 U/L (ref 38–126)
Anion gap: 7 (ref 5–15)
BUN: 16 mg/dL (ref 6–20)
CO2: 22 mmol/L (ref 22–32)
Calcium: 9.5 mg/dL (ref 8.9–10.3)
Chloride: 108 mmol/L (ref 98–111)
Creatinine, Ser: 0.74 mg/dL (ref 0.44–1.00)
GFR, Estimated: >60 mL/min (ref >60)
Glucose, Bld: 96 mg/dL (ref 70–99)
Potassium: 4.2 mmol/L (ref 3.5–5.1)
Sodium: 137 mmol/L (ref 135–145)
Total Bilirubin: 0.6 mg/dL (ref 0.3–1.2)
Total Protein: 7.8 g/dL (ref 6.5–8.1)

## 2023-02-21 LAB — CBC WITH DIFFERENTIAL/PLATELET
Abs Immature Granulocytes: 0.03 10*3/uL (ref 0.00–0.07)
Basophils Absolute: 0 10*3/uL (ref 0.0–0.1)
Basophils Relative: 0 %
Eosinophils Absolute: 0.1 10*3/uL (ref 0.0–0.5)
Eosinophils Relative: 1 %
HCT: 42.8 % (ref 36.0–46.0)
Hemoglobin: 13 g/dL (ref 12.0–15.0)
Immature Granulocytes: 0 %
Lymphocytes Relative: 30 %
Lymphs Abs: 2.1 10*3/uL (ref 0.7–4.0)
MCH: 22.1 pg — ABNORMAL LOW (ref 26.0–34.0)
MCHC: 30.4 g/dL (ref 30.0–36.0)
MCV: 72.8 fL — ABNORMAL LOW (ref 80.0–100.0)
Monocytes Absolute: 0.6 10*3/uL (ref 0.1–1.0)
Monocytes Relative: 8 %
Neutro Abs: 4.3 10*3/uL (ref 1.7–7.7)
Neutrophils Relative %: 61 %
Platelets: 305 10*3/uL (ref 150–400)
RBC: 5.88 MIL/uL — ABNORMAL HIGH (ref 3.87–5.11)
RDW: 15.5 % (ref 11.5–15.5)
WBC: 7.1 10*3/uL (ref 4.0–10.5)
nRBC: 0 % (ref 0.0–0.2)

## 2023-02-21 LAB — POC URINE PREG, ED: Preg Test, Ur: NEGATIVE

## 2023-02-21 LAB — URINALYSIS, ROUTINE W REFLEX MICROSCOPIC
Bilirubin Urine: NEGATIVE
Glucose, UA: NEGATIVE mg/dL
Hgb urine dipstick: NEGATIVE
Ketones, ur: NEGATIVE mg/dL
Leukocytes,Ua: NEGATIVE
Nitrite: NEGATIVE
Protein, ur: NEGATIVE mg/dL
Specific Gravity, Urine: 1.019 (ref 1.005–1.030)
pH: 6 (ref 5.0–8.0)

## 2023-02-21 LAB — LIPASE, BLOOD: Lipase: 30 U/L (ref 11–51)

## 2023-02-21 MED ORDER — METHOCARBAMOL 500 MG PO TABS: 500.0000 mg | ORAL_TABLET | Freq: Four times a day (QID) | ORAL | 0 refills | Status: AC | PRN

## 2023-02-21 MED ORDER — KETOROLAC TROMETHAMINE 10 MG PO TABS: 10.0000 mg | ORAL_TABLET | Freq: Four times a day (QID) | ORAL | 0 refills | Status: AC | PRN

## 2023-02-21 MED ORDER — KETOROLAC TROMETHAMINE 30 MG/ML IJ SOLN
30.0000 mg | Freq: Once | INTRAMUSCULAR | Status: AC
  Administered 2023-02-21: 30 mg via INTRAMUSCULAR
  Filled 2023-02-21: qty 1

## 2023-02-21 NOTE — ED Notes (Signed)
See triage note  Presents with left side flank pain  States pain became worse over the night  Hx of renal stones  Slight nausea

## 2023-02-21 NOTE — ED Provider Notes (Signed)
[facility] Provider Note    Event Date/Time   First MD Initiated Contact with Patient 02/21/23 [phone]     (approximate)   History   Flank Pain   HPI  Ellen Reese is a 32 y.o. female   presents to the ED with complaint of left flank pain that started Sunday afternoon and became worse overnight.  Patient currently denies any hematuria but does have a history of kidney stones in the past with the last one being 3 years ago.  No known injury.      Physical Exam   Triage Vital Signs: ED Triage Vitals  Encounter Vitals Group     BP 02/21/23 0654 131/81     Systolic BP Percentile --      Diastolic BP Percentile --      Pulse Rate 02/21/23 0654 78     Resp 02/21/23 0654 16     Temp 02/21/23 0654 99 F (37.2 C)     Temp Source 02/21/23 0654 Oral     SpO2 02/21/23 0654 99 %     Weight 02/21/23 0628 172 lb (78 kg)     Height 02/21/23 0628 5\' 2"  (1.575 m)     Head Circumference --      Peak Flow --      Pain Score 02/21/23 0628 6     Pain Loc --      Pain Education --      Exclude from Growth Chart --     Most recent vital signs: Vitals:   02/21/23 0654  BP: 131/81  Pulse: 78  Resp: 16  Temp: 99 F (37.2 C)  SpO2: 99%     General: Awake, no distress.  CV:  Good peripheral perfusion.  Resp:  Normal effort.  Abd:  No distention.  Other:  No point tenderness on palpation of the vertebral bodies in the thoracic or lumbar region.  There is however some tenderness on palpation of the left vertebral muscles which is in the area that she complains of.  Range of motion is without restriction.  Patient is able to stand and ambulate without any assistance.  She does have a mild antalgic gait.   ED Results / Procedures / Treatments   Labs (all labs ordered are listed, but only abnormal results are displayed) Labs Reviewed  CBC WITH DIFFERENTIAL/PLATELET - Abnormal; Notable for the following components:      Result Value   RBC 5.88 (*)    MCV  72.8 (*)    MCH 22.1 (*)    All other components within normal limits  URINALYSIS, ROUTINE W REFLEX MICROSCOPIC - Abnormal; Notable for the following components:   Color, Urine YELLOW (*)    APPearance CLEAR (*)    All other components within normal limits  COMPREHENSIVE METABOLIC PANEL  LIPASE, BLOOD  POC URINE PREG, ED     RADIOLOGY CT scan renal stone study per radiologist is negative.    PROCEDURES:  Critical Care performed:   Procedures   MEDICATIONS ORDERED IN ED: Medications  ketorolac (TORADOL) 30 MG/ML injection 30 mg (30 mg Intramuscular Given 02/21/23 0854)     IMPRESSION / MDM / ASSESSMENT AND PLAN / ED COURSE  I reviewed the triage vital signs and the nursing notes.   Differential diagnosis includes, but is not limited to, urolithiasis, urinary tract infection, muscle skeletal strain, muscle skeletal pain.  32 year old female presents to the ED with complaint of left flank pain that began  "Sunday afternoon without known history of injury.  Patient was worried that she has a kidney stone however CT scan was reassuring and urinalysis was negative.  Patient was given Toradol 30 mg IM while in the ED and a prescription was sent to the pharmacy for methocarbamol and Toradol to begin taking.  Patient is aware that she should not drive or operate machinery with taking the methocarbamol as it could cause drowsiness.    Patient's presentation is most consistent with acute complicated illness / injury requiring diagnostic workup.  FINAL CLINICAL IMPRESSION(S) / ED DIAGNOSES   Final diagnoses:  Musculoskeletal back pain     Rx / DC Orders   ED Discharge Orders          Ordered    methocarbamol (ROBAXIN) 500 MG tablet  Every 6 hours PRN        02/21/23 0849    ketorolac (TORADOL) 10 MG tablet  Every 6 hours PRN        10" /07/24 0849             Note:  This document was prepared using Dragon voice recognition software and may include unintentional  dictation errors.   Tommi Rumps, PA-C 02/21/23 0915    Merwyn Katos, MD 02/22/23 [phone]

## 2023-02-21 NOTE — Discharge Instructions (Addendum)
Follow-up with your primary care provider or urgent care if any continued problems or concerns.  You may use ice or heat to your back as needed for discomfort. 2 prescriptions were sent to the pharmacy.  Both are to be taken every 6 hours as needed for muscle pain or muscle spasms.  Be aware that the methocarbamol could cause drowsiness and should not be taken while you are driving or operating machinery.

## 2023-02-21 NOTE — ED Triage Notes (Signed)
Patient reports left sided flank pain since Sunday that became worse overnight.  Patient with hx of kidney stones (last 3 yrs ago).

## 2023-04-06 DIAGNOSIS — Z01419 Encounter for gynecological examination (general) (routine) without abnormal findings: Secondary | ICD-10-CM | POA: Diagnosis not present

## 2023-04-06 DIAGNOSIS — Z124 Encounter for screening for malignant neoplasm of cervix: Secondary | ICD-10-CM | POA: Diagnosis not present

## 2023-04-06 DIAGNOSIS — Z6834 Body mass index (BMI) 34.0-34.9, adult: Secondary | ICD-10-CM | POA: Diagnosis not present

## 2023-07-04 DIAGNOSIS — R7303 Prediabetes: Secondary | ICD-10-CM | POA: Diagnosis not present

## 2023-07-04 DIAGNOSIS — Z0000 Encounter for general adult medical examination without abnormal findings: Secondary | ICD-10-CM | POA: Diagnosis not present

## 2023-07-04 DIAGNOSIS — Z23 Encounter for immunization: Secondary | ICD-10-CM | POA: Diagnosis not present

## 2023-07-04 DIAGNOSIS — Z13220 Encounter for screening for lipoid disorders: Secondary | ICD-10-CM | POA: Diagnosis not present

## 2023-07-04 DIAGNOSIS — L2082 Flexural eczema: Secondary | ICD-10-CM | POA: Diagnosis not present

## 2023-07-11 DIAGNOSIS — L309 Dermatitis, unspecified: Secondary | ICD-10-CM | POA: Diagnosis not present

## 2023-07-11 DIAGNOSIS — R41840 Attention and concentration deficit: Secondary | ICD-10-CM | POA: Diagnosis not present

## 2023-09-02 ENCOUNTER — Other Ambulatory Visit: Payer: Self-pay

## 2023-09-02 DIAGNOSIS — N949 Unspecified condition associated with female genital organs and menstrual cycle: Secondary | ICD-10-CM

## 2023-09-02 MED ORDER — MEDROXYPROGESTERONE ACETATE 10 MG PO TABS: 10.0000 mg | ORAL_TABLET | Freq: Every day | ORAL | 0 refills | Status: AC

## 2023-09-06 DIAGNOSIS — M25561 Pain in right knee: Secondary | ICD-10-CM | POA: Diagnosis not present

## 2023-09-07 ENCOUNTER — Encounter: Payer: Self-pay | Admitting: Orthopedic Surgery

## 2023-09-07 ENCOUNTER — Other Ambulatory Visit: Payer: Self-pay | Admitting: Orthopedic Surgery

## 2023-09-07 DIAGNOSIS — M25561 Pain in right knee: Secondary | ICD-10-CM | POA: Diagnosis not present

## 2023-09-07 DIAGNOSIS — M79661 Pain in right lower leg: Secondary | ICD-10-CM | POA: Diagnosis not present

## 2023-09-08 ENCOUNTER — Ambulatory Visit
Admission: RE | Admit: 2023-09-08 | Discharge: 2023-09-08 | Discharge status: Home / Self Care | Source: Ambulatory Visit | Attending: Orthopedic Surgery | Admitting: Orthopedic Surgery

## 2023-09-08 DIAGNOSIS — M25561 Pain in right knee: Secondary | ICD-10-CM

## 2023-09-08 DIAGNOSIS — R600 Localized edema: Secondary | ICD-10-CM | POA: Diagnosis not present

## 2023-09-08 MED ORDER — GADOPICLENOL 0.5 MMOL/ML IV SOLN
7.5000 mL | Freq: Once | INTRAVENOUS | Status: AC | PRN
  Administered 2023-09-08: 7.5 mL via INTRAVENOUS

## 2023-09-09 ENCOUNTER — Other Ambulatory Visit

## 2023-10-04 DIAGNOSIS — M899 Disorder of bone, unspecified: Secondary | ICD-10-CM | POA: Diagnosis not present

## 2023-10-04 DIAGNOSIS — M25561 Pain in right knee: Secondary | ICD-10-CM | POA: Diagnosis not present

## 2023-10-05 DIAGNOSIS — M25561 Pain in right knee: Secondary | ICD-10-CM | POA: Diagnosis not present

## 2023-10-05 DIAGNOSIS — M899 Disorder of bone, unspecified: Secondary | ICD-10-CM | POA: Diagnosis not present

## 2023-10-06 DIAGNOSIS — M899 Disorder of bone, unspecified: Secondary | ICD-10-CM | POA: Diagnosis not present

## 2023-10-11 DIAGNOSIS — M899 Disorder of bone, unspecified: Secondary | ICD-10-CM | POA: Diagnosis not present

## 2023-10-11 DIAGNOSIS — D480 Neoplasm of uncertain behavior of bone and articular cartilage: Secondary | ICD-10-CM | POA: Diagnosis not present

## 2023-10-26 DIAGNOSIS — M25561 Pain in right knee: Secondary | ICD-10-CM | POA: Diagnosis not present

## 2023-10-26 DIAGNOSIS — M899 Disorder of bone, unspecified: Secondary | ICD-10-CM | POA: Diagnosis not present

## 2023-10-28 DIAGNOSIS — M25561 Pain in right knee: Secondary | ICD-10-CM | POA: Diagnosis not present

## 2023-10-28 DIAGNOSIS — M899 Disorder of bone, unspecified: Secondary | ICD-10-CM | POA: Diagnosis not present

## 2023-11-01 DIAGNOSIS — Z4789 Encounter for other orthopedic aftercare: Secondary | ICD-10-CM | POA: Diagnosis not present

## 2023-11-07 DIAGNOSIS — R41840 Attention and concentration deficit: Secondary | ICD-10-CM | POA: Diagnosis not present

## 2023-11-17 DIAGNOSIS — R41840 Attention and concentration deficit: Secondary | ICD-10-CM | POA: Diagnosis not present

## 2023-11-17 DIAGNOSIS — F419 Anxiety disorder, unspecified: Secondary | ICD-10-CM | POA: Diagnosis not present

## 2023-11-17 DIAGNOSIS — F411 Generalized anxiety disorder: Secondary | ICD-10-CM | POA: Diagnosis not present

## 2023-11-17 DIAGNOSIS — F338 Other recurrent depressive disorders: Secondary | ICD-10-CM | POA: Diagnosis not present

## 2024-02-09 DIAGNOSIS — N771 Vaginitis, vulvitis and vulvovaginitis in diseases classified elsewhere: Secondary | ICD-10-CM | POA: Diagnosis not present

## 2024-02-09 DIAGNOSIS — N9489 Other specified conditions associated with female genital organs and menstrual cycle: Secondary | ICD-10-CM | POA: Diagnosis not present

## 2024-02-09 DIAGNOSIS — N760 Acute vaginitis: Secondary | ICD-10-CM | POA: Diagnosis not present

## 2024-02-13 DIAGNOSIS — F411 Generalized anxiety disorder: Secondary | ICD-10-CM | POA: Diagnosis not present

## 2024-02-13 DIAGNOSIS — Z111 Encounter for screening for respiratory tuberculosis: Secondary | ICD-10-CM | POA: Diagnosis not present

## 2024-02-13 DIAGNOSIS — G479 Sleep disorder, unspecified: Secondary | ICD-10-CM | POA: Diagnosis not present
